# Patient Record
Sex: Female | Born: 1980 | Race: White | Hispanic: No | Marital: Married | Smoking: Never smoker
Health system: Southern US, Community
[De-identification: ages and names within clinical notes are randomized; demographics above are authoritative.]

## PROBLEM LIST (undated history)

## (undated) ENCOUNTER — Inpatient Hospital Stay (HOSPITAL_COMMUNITY): Payer: Self-pay

## (undated) DIAGNOSIS — R112 Nausea with vomiting, unspecified: Secondary | ICD-10-CM

## (undated) DIAGNOSIS — R0602 Shortness of breath: Secondary | ICD-10-CM

## (undated) DIAGNOSIS — O021 Missed abortion: Secondary | ICD-10-CM

## (undated) DIAGNOSIS — Z789 Other specified health status: Secondary | ICD-10-CM

## (undated) DIAGNOSIS — J45909 Unspecified asthma, uncomplicated: Secondary | ICD-10-CM

## (undated) DIAGNOSIS — Z9889 Other specified postprocedural states: Secondary | ICD-10-CM

## (undated) DIAGNOSIS — I7300 Raynaud's syndrome without gangrene: Secondary | ICD-10-CM

## (undated) HISTORY — PX: WISDOM TOOTH EXTRACTION: SHX21

## (undated) HISTORY — PX: KNEE SURGERY: SHX244

---

## 2010-10-08 ENCOUNTER — Inpatient Hospital Stay (HOSPITAL_COMMUNITY)
Admission: AD | Admit: 2010-10-08 | Discharge: 2010-10-10 | DRG: 373 | Disposition: A | Discharge status: Home / Self Care | Payer: BC Managed Care – PPO | Source: Ambulatory Visit | Attending: Obstetrics & Gynecology | Admitting: Obstetrics & Gynecology

## 2010-10-08 ENCOUNTER — Encounter (HOSPITAL_COMMUNITY): Payer: Self-pay

## 2010-10-08 LAB — CBC
HCT: 42.7 % (ref 36.0–46.0)
Hemoglobin: 15.2 g/dL — ABNORMAL HIGH (ref 12.0–15.0)
MCHC: 35.6 g/dL (ref 30.0–36.0)
RBC: 4.55 MIL/uL (ref 3.87–5.11)
WBC: 19.1 10*3/uL — ABNORMAL HIGH (ref 4.0–10.5)

## 2010-10-08 LAB — ABO/RH: RH Type: POSITIVE

## 2010-10-08 LAB — RPR: RPR: NONREACTIVE

## 2010-10-08 MED ORDER — OXYTOCIN 20 UNITS IN LACTATED RINGERS INFUSION - SIMPLE: 125.0000 mL/h | Freq: Once | INTRAVENOUS | Status: DC

## 2010-10-08 MED ORDER — BUTORPHANOL TARTRATE 2 MG/ML IJ SOLN
1.0000 mg | INTRAMUSCULAR | Status: DC | PRN
  Administered 2010-10-08 (×2): 1 mg via INTRAVENOUS
  Filled 2010-10-08 (×2): qty 1

## 2010-10-08 MED ORDER — OXYTOCIN BOLUS FROM INFUSION
500.0000 mL | Freq: Once | INTRAVENOUS | Status: DC
  Filled 2010-10-08: qty 1000
  Filled 2010-10-08: qty 500

## 2010-10-08 MED ORDER — FENTANYL 2.5 MCG/ML BUPIVACAINE 1/10 % EPIDURAL INFUSION (WH - ANES)
14.0000 mL/h | INTRAMUSCULAR | Status: DC
  Filled 2010-10-08: qty 60

## 2010-10-08 MED ORDER — ACETAMINOPHEN 325 MG PO TABS: 650.0000 mg | ORAL_TABLET | ORAL | Status: DC | PRN

## 2010-10-08 MED ORDER — EPHEDRINE 5 MG/ML INJ
10.0000 mg | INTRAVENOUS | Status: DC | PRN
  Filled 2010-10-08 (×2): qty 4

## 2010-10-08 MED ORDER — EPHEDRINE 5 MG/ML INJ
10.0000 mg | INTRAVENOUS | Status: DC | PRN
  Filled 2010-10-08: qty 4

## 2010-10-08 MED ORDER — LACTATED RINGERS IV SOLN: 500.0000 mL | Freq: Once | INTRAVENOUS | Status: DC

## 2010-10-08 MED ORDER — PHENYLEPHRINE 40 MCG/ML (10ML) SYRINGE FOR IV PUSH (FOR BLOOD PRESSURE SUPPORT)
80.0000 ug | PREFILLED_SYRINGE | INTRAVENOUS | Status: DC | PRN
  Filled 2010-10-08 (×2): qty 5

## 2010-10-08 MED ORDER — FLEET ENEMA 7-19 GM/118ML RE ENEM: 1.0000 | ENEMA | RECTAL | Status: DC | PRN

## 2010-10-08 MED ORDER — CITRIC ACID-SODIUM CITRATE 334-500 MG/5ML PO SOLN: 30.0000 mL | ORAL | Status: DC | PRN

## 2010-10-08 MED ORDER — LACTATED RINGERS IV SOLN: 500.0000 mL | INTRAVENOUS | Status: DC | PRN

## 2010-10-08 MED ORDER — LACTATED RINGERS IV SOLN
INTRAVENOUS | Status: DC
  Administered 2010-10-08: 300 mL via INTRAVENOUS
  Administered 2010-10-08: 21:00:00 via INTRAVENOUS

## 2010-10-08 MED ORDER — ONDANSETRON HCL 4 MG/2ML IJ SOLN: 4.0000 mg | Freq: Four times a day (QID) | INTRAMUSCULAR | Status: DC | PRN

## 2010-10-08 MED ORDER — IBUPROFEN 600 MG PO TABS: 600.0000 mg | ORAL_TABLET | Freq: Four times a day (QID) | ORAL | Status: DC | PRN

## 2010-10-08 MED ORDER — PHENYLEPHRINE 40 MCG/ML (10ML) SYRINGE FOR IV PUSH (FOR BLOOD PRESSURE SUPPORT)
80.0000 ug | PREFILLED_SYRINGE | INTRAVENOUS | Status: DC | PRN
  Filled 2010-10-08: qty 5

## 2010-10-08 MED ORDER — LIDOCAINE HCL (PF) 1 % IJ SOLN
30.0000 mL | INTRAMUSCULAR | Status: DC | PRN
  Filled 2010-10-08 (×2): qty 30

## 2010-10-08 MED ORDER — DIPHENHYDRAMINE HCL 50 MG/ML IJ SOLN: 12.5000 mg | INTRAMUSCULAR | Status: DC | PRN

## 2010-10-08 NOTE — Progress Notes (Signed)
Has had some small leaking episodes but had a big gush at 1730, irreg contractions

## 2010-10-08 NOTE — Progress Notes (Signed)
Birth plan reviewed with patient and Dr. Aldona Bar. Pt may walk and be monitored intermittently and continuously once walking is completed, hold IV until active labor or patient requests pain meds.

## 2010-10-08 NOTE — Progress Notes (Signed)
Progressed quickly and had a NSD of viable 7 pound 8 oz. Female infant over an intact perineum/Apgars 8/9.  Cord Blood Donor.  Placenta delivered intact.  2A/1V.  No significant tears.  Breast feed.

## 2010-10-08 NOTE — H&P (Signed)
30 year old G1P0 at 39+ weeks with SROM at 1730 today-clear fluid.  Now with some contractions.  Pregnancy benign.  -GBS.  Had an U/S late July for EFW and was "50%".  \  Allergic to Codeine.  PE:  VS stable Abd:  S+39 / FH reactive Cx:  3/80/vtx -1-2 --well applied  IMP:  Term IUP/SROM  Plan: Admit. Patient has a detailed birth plan and wishes to be involved in all decisions.  Does prefer to walk for now and be intermittently monitored until in active labor, etc.

## 2010-10-09 ENCOUNTER — Encounter (HOSPITAL_COMMUNITY): Payer: Self-pay | Admitting: *Deleted

## 2010-10-09 LAB — RPR: RPR Ser Ql: NONREACTIVE

## 2010-10-09 LAB — CBC
HCT: 41.2 % (ref 36.0–46.0)
MCV: 95.6 fL (ref 78.0–100.0)
RBC: 4.31 MIL/uL (ref 3.87–5.11)
WBC: 27.4 10*3/uL — ABNORMAL HIGH (ref 4.0–10.5)

## 2010-10-09 MED ORDER — TETANUS-DIPHTH-ACELL PERTUSSIS 5-2.5-18.5 LF-MCG/0.5 IM SUSP: 0.5000 mL | Freq: Once | INTRAMUSCULAR | Status: DC

## 2010-10-09 MED ORDER — ONDANSETRON HCL 4 MG PO TABS: 4.0000 mg | ORAL_TABLET | ORAL | Status: DC | PRN

## 2010-10-09 MED ORDER — ONDANSETRON HCL 4 MG/2ML IJ SOLN: 4.0000 mg | INTRAMUSCULAR | Status: DC | PRN

## 2010-10-09 MED ORDER — BENZOCAINE-MENTHOL 20-0.5 % EX AERO
INHALATION_SPRAY | CUTANEOUS | Status: AC
  Filled 2010-10-09: qty 56

## 2010-10-09 MED ORDER — ZOLPIDEM TARTRATE 5 MG PO TABS: 5.0000 mg | ORAL_TABLET | Freq: Every evening | ORAL | Status: DC | PRN

## 2010-10-09 MED ORDER — IBUPROFEN 600 MG PO TABS
600.0000 mg | ORAL_TABLET | Freq: Four times a day (QID) | ORAL | Status: DC
  Administered 2010-10-09: 600 mg via ORAL
  Filled 2010-10-09 (×3): qty 1

## 2010-10-09 MED ORDER — HYDROCORTISONE ACE-PRAMOXINE 1-1 % RE FOAM
1.0000 | Freq: Two times a day (BID) | RECTAL | Status: DC
  Administered 2010-10-10: 1 via RECTAL
  Filled 2010-10-09 (×2): qty 10

## 2010-10-09 MED ORDER — SIMETHICONE 80 MG PO CHEW: 80.0000 mg | CHEWABLE_TABLET | ORAL | Status: DC | PRN

## 2010-10-09 MED ORDER — LANOLIN HYDROUS EX OINT: TOPICAL_OINTMENT | CUTANEOUS | Status: DC | PRN

## 2010-10-09 MED ORDER — DIPHENHYDRAMINE HCL 25 MG PO CAPS: 25.0000 mg | ORAL_CAPSULE | Freq: Four times a day (QID) | ORAL | Status: DC | PRN

## 2010-10-09 MED ORDER — PRENATAL PLUS 27-1 MG PO TABS
1.0000 | ORAL_TABLET | Freq: Every day | ORAL | Status: DC
  Administered 2010-10-09 – 2010-10-10 (×2): 1 via ORAL
  Filled 2010-10-09 (×2): qty 1

## 2010-10-09 MED ORDER — DIBUCAINE 1 % RE OINT
TOPICAL_OINTMENT | RECTAL | Status: DC | PRN
Start: 2010-10-09 — End: 2010-10-10
  Administered 2010-10-10: 10:00:00 via RECTAL
  Filled 2010-10-09 (×2): qty 28

## 2010-10-09 MED ORDER — BENZOCAINE-MENTHOL 20-0.5 % EX AERO
1.0000 "application " | INHALATION_SPRAY | CUTANEOUS | Status: DC | PRN
  Administered 2010-10-09: 1 via TOPICAL

## 2010-10-09 MED ORDER — SENNOSIDES-DOCUSATE SODIUM 8.6-50 MG PO TABS
2.0000 | ORAL_TABLET | Freq: Every day | ORAL | Status: DC
  Administered 2010-10-09: 2 via ORAL

## 2010-10-09 NOTE — Progress Notes (Signed)
2240 Pt requesting epidural. SVE, pt complete and ready to push. Report provided to Dr. Aldona Bar via telephone. Instructed to push with patient he will come in immediately.

## 2010-10-09 NOTE — Progress Notes (Signed)
Post Partum Day 1 Subjective: no complaints  Objective: Blood pressure 122/71, pulse 83, temperature 98.4 F (36.9 C), temperature source Oral, resp. rate 20, height 5\' 6"  (1.676 m), weight 94.348 kg (208 lb), unknown if currently breastfeeding.  Physical Exam:  General: alert Lochia: appropriate Uterine Fundus: firm   Basename 10/09/10 0520 10/08/10 2105  HGB 14.3 15.2*  HCT 41.2 42.7    Assessment/Plan: Plan for discharge tomorrow   LOS: 1 day   Stephanie Herman D 10/09/2010, 9:40 AM

## 2010-10-10 NOTE — Progress Notes (Signed)
Post Partum Day 2 Subjective: no complaints  Objective: Blood pressure 115/72, pulse 65, temperature 98.4 F (36.9 C), temperature source Oral, resp. rate 20, height 5\' 6"  (1.676 m), weight 94.348 kg (208 lb), unknown if currently breastfeeding.  Physical Exam:  General: alert Lochia: appropriate Uterine Fundus: firm   Basename 10/09/10 0520 10/08/10 2105  HGB 14.3 15.2*  HCT 41.2 42.7    Assessment/Plan: Discharge home and Breastfeeding   LOS: 2 days   KAPLAN,RICHARD D 10/10/2010, 9:54 AM

## 2010-10-10 NOTE — Discharge Summary (Signed)
Obstetric Discharge Summary Reason for Admission: onset of labor Prenatal Procedures: ultrasound Intrapartum Procedures: spontaneous vaginal delivery Postpartum Procedures: none Complications-Operative and Postpartum: none Hemoglobin  Date Value Range Status  10/09/2010 14.3  12.0-15.0 (g/dL) Final     HCT  Date Value Range Status  10/09/2010 41.2  36.0-46.0 (%) Final    Discharge Diagnoses: Term Pregnancy-delivered  Discharge Information: Date: 10/10/2010 Activity: pelvic rest Diet: routine Medications: PNV and Ibuprophen Condition: stable Instructions: refer to practice specific booklet Discharge to: home Follow-up Information    Follow up with WEIN,ROBERT M in 5 weeks.   Contact information:   [address] [place] [address] [phone]          Newborn Data: Live born female  Birth Weight: 7 lb 7.8 oz (3395 g) APGAR: 8, 9  Home with mother.  KAPLAN,RICHARD D 10/10/2010, 9:57 AM

## 2011-06-07 ENCOUNTER — Inpatient Hospital Stay (HOSPITAL_COMMUNITY)
Admission: AD | Admit: 2011-06-07 | Discharge: 2011-06-07 | Disposition: A | Discharge status: Home / Self Care | Payer: BC Managed Care – PPO | Source: Ambulatory Visit | Attending: Obstetrics & Gynecology | Admitting: Obstetrics & Gynecology

## 2011-06-07 ENCOUNTER — Encounter (HOSPITAL_COMMUNITY): Payer: Self-pay | Admitting: *Deleted

## 2011-06-07 DIAGNOSIS — O209 Hemorrhage in early pregnancy, unspecified: Secondary | ICD-10-CM

## 2011-06-07 HISTORY — DX: Other specified health status: Z78.9

## 2011-06-07 LAB — HCG, QUANTITATIVE, PREGNANCY: hCG, Beta Chain, Quant, S: 14087 m[IU]/mL — ABNORMAL HIGH (ref <5)

## 2011-06-07 NOTE — MAU Note (Signed)
Pt in believes she is having a miscarriage. LMP 04/28/11.  Sudden onset of severe cramping and heavy bleeding this morning with large clots.  Clots have decreased and pain has subsided, but still bleeding pretty heavy.  Has not taken UPT.

## 2011-06-07 NOTE — Discharge Instructions (Signed)
Vaginal Bleeding During Pregnancy, First Trimester  A small amount of bleeding (spotting) is relatively common in early pregnancy. It usually stops on its own. There are many causes for bleeding or spotting in early pregnancy. Some bleeding may be related to the pregnancy and some may not. Cramping with the bleeding is more serious and concerning. Tell your caregiver if you have any vaginal bleeding.   CAUSES    It is normal in most cases.   The pregnancy ends (miscarriage).   The pregnancy may end (threatened miscarriage).   Infection or inflammation of the cervix.   Growths (polyps) on the cervix.   Pregnancy happens outside of the uterus and in a fallopian tube (tubal pregnancy).   Many tiny cysts in the uterus instead of pregnancy tissue (molar pregnancy).  SYMPTOMS   Vaginal bleeding or spotting with or without cramps.  DIAGNOSIS   To evaluate the pregnancy, your caregiver may:   Do a pelvic exam.   Take blood tests.   Do an ultrasound.  It is very important to follow your caregiver's instructions.   TREATMENT    Evaluation of the pregnancy with blood tests and ultrasound.   Bed rest (getting up to use the bathroom only).   Rho-gam immunization if the mother is Rh negative and the father is Rh positive.  HOME CARE INSTRUCTIONS    If your caregiver orders bed rest, you may need to make arrangements for the care of other children and for other responsibilities. However, your caregiver may allow you to continue light activity.   Keep track of the number of pads you use each day, how often you change pads and how soaked (saturated) they are. Write this down.   Do not use tampons. Do not douche.   Do not have sexual intercourse or orgasms until approved by your physician.   Save any tissue that you pass for your caregiver to see.   Take medicine for cramps only with your caregiver's permission.   Do not take aspirin because it can make you bleed.  SEEK IMMEDIATE MEDICAL CARE IF:    You  experience severe cramps in your stomach, back or belly (abdomen).   You have an oral temperature above 102 F (38.9 C), not controlled by medicine.   You pass large clots or tissue.   Your bleeding increases or you become light-headed, weak or have fainting episodes.   You develop chills.   You are leaking or have a gush of fluid from your vagina.   You pass out while having a bowel movement. That may mean you have a ruptured tubal pregnancy.  Document Released: 11/04/2004 Document Revised: 01/14/2011 Document Reviewed: 05/16/2008  [employer] Patient Information 2012 [employer].

## 2011-06-07 NOTE — MAU Note (Signed)
Patient states she is late for her period and believes that she was pregnant. Had heavy bleeding with clots and a lot of cramping this am. Continues to have bleeding but no pain at this time. Had not had confirmation of pregnancy.

## 2011-06-07 NOTE — MAU Provider Note (Signed)
  History     CSN: [id number]  Arrival date and time: 06/07/11 6644   First Provider Initiated Contact with Patient 06/07/11 0920      Chief Complaint  Patient presents with  . Vaginal Bleeding   HPI Stephanie Herman is 31 y.o. G2P1001, patient of Dr. Tenny Craw, [redacted]w[redacted]d weeks presenting with report she thought she started her period yesterday am and woke up 5am today changed tampon, severe cramping over the next hour with heavy bleeding/3 large clot.  Bleeding has slowed.  Passed 1 clot here. Spoke with Dr. Arlyce Dice this am instructed to come here for evaluation.     Past Medical History  Diagnosis Date  . No pertinent past medical history   . Complication of anesthesia     nausea and vomiting    Past Surgical History  Procedure Date  . Knee surgery   . Wisdom tooth extraction     Family History  Problem Relation Age of Onset  . Anesthesia problems Neg Hx     History  Substance Use Topics  . Smoking status: Never Smoker   . Smokeless tobacco: Never Used  . Alcohol Use: No    Allergies:  Allergies  Allergen Reactions  . Codeine Other (See Comments)    Body goes into shock  . Lidocaine Other (See Comments)    Pt states Liquid Anesthesia makes her violently sick.  Can tolerate topical    Prescriptions prior to admission  Medication Sig Dispense Refill  . prenatal vitamin w/FE, FA (PRENATAL 1 + 1) 27-1 MG TABS Take 1 tablet by mouth daily.        . Tetrahydrozoline HCl (EYE DROPS OP) Apply to eye.          Review of Systems  Gastrointestinal: Positive for abdominal pain (cramping).  Genitourinary:       + for vaginal bleeding and clots   Physical Exam   Blood pressure 133/69, pulse 59, temperature 98 F (36.7 C), temperature source Oral, resp. rate 16, height 5\' 5"  (1.651 m), weight 71.578 kg (157 lb 12.8 oz), last menstrual period 04/29/2011, SpO2 100.00%, currently breastfeeding.  Physical Exam  Constitutional: She is oriented to person, place, and time. She  appears well-developed and well-nourished. No distress.  HENT:  Head: Normocephalic.  Neck: Normal range of motion.  Cardiovascular: Normal rate.   Respiratory: Effort normal.  Genitourinary:       Not indicated, bleeding light per her report  Neurological: She is alert and oriented to person, place, and time.  Psychiatric: She has a normal mood and affect. Her behavior is normal.   Results for orders placed during the hospital encounter of 06/07/11 (from the past 24 hour(s))  POCT PREGNANCY, URINE     Status: Abnormal   Collection Time   06/07/11  9:19 AM      Component Value Range   Preg Test, Ur POSITIVE (*) NEGATIVE    MAU Course  Procedures  BHCG drawn  MDM 09:30  Consulted with Dr. Aldona Bar.  Discussed patient's sxs, + UPT.  Order given to draw BHCG and have her call office for repeat in 2 days.  The patient is stable, sitting and nursing her 84 month old.    Assessment and Plan  A:  Bleeding in early pregnancy   P:  Call office for appt on Wednesday for repeat BHCG.     INstructed to call her doctor for increased pain or bleeding.  Stephanie Herman,EVE M 06/07/2011, 9:23 AM

## 2011-10-25 ENCOUNTER — Encounter (HOSPITAL_COMMUNITY): Payer: Self-pay | Admitting: Anesthesiology

## 2011-10-25 ENCOUNTER — Encounter (HOSPITAL_COMMUNITY): Payer: Self-pay | Admitting: *Deleted

## 2011-10-25 ENCOUNTER — Ambulatory Visit (HOSPITAL_COMMUNITY)
Admission: AD | Admit: 2011-10-25 | Discharge: 2011-10-25 | Disposition: A | Discharge status: Home / Self Care | Payer: BC Managed Care – PPO | Source: Ambulatory Visit | Attending: Obstetrics and Gynecology | Admitting: Obstetrics and Gynecology

## 2011-10-25 ENCOUNTER — Ambulatory Visit (HOSPITAL_COMMUNITY): Payer: BC Managed Care – PPO | Admitting: Anesthesiology

## 2011-10-25 ENCOUNTER — Encounter (HOSPITAL_COMMUNITY)
Admission: AD | Disposition: A | Discharge status: Home / Self Care | Payer: Self-pay | Source: Ambulatory Visit | Attending: Obstetrics and Gynecology

## 2011-10-25 DIAGNOSIS — O021 Missed abortion: Secondary | ICD-10-CM | POA: Insufficient documentation

## 2011-10-25 HISTORY — DX: Raynaud's syndrome without gangrene: I73.00

## 2011-10-25 HISTORY — DX: Missed abortion: O02.1

## 2011-10-25 HISTORY — DX: Other specified postprocedural states: Z98.890

## 2011-10-25 HISTORY — DX: Nausea with vomiting, unspecified: R11.2

## 2011-10-25 HISTORY — PX: DILATION AND EVACUATION: SHX1459

## 2011-10-25 HISTORY — DX: Shortness of breath: R06.02

## 2011-10-25 LAB — CBC
HCT: 41.2 % (ref 36.0–46.0)
Hemoglobin: 14.1 g/dL (ref 12.0–15.0)
MCH: 31.1 pg (ref 26.0–34.0)
MCV: 90.9 fL (ref 78.0–100.0)
Platelets: 133 10*3/uL — ABNORMAL LOW (ref 150–400)
RBC: 4.53 MIL/uL (ref 3.87–5.11)
WBC: 4 10*3/uL (ref 4.0–10.5)

## 2011-10-25 LAB — TYPE AND SCREEN

## 2011-10-25 LAB — ABO/RH: ABO/RH(D): A POS

## 2011-10-25 LAB — T4, FREE: Free T4: 1.41 ng/dL (ref 0.80–1.80)

## 2011-10-25 SURGERY — DILATION AND EVACUATION, UTERUS
Anesthesia: General | Site: Uterus | Wound class: Clean Contaminated

## 2011-10-25 MED ORDER — PROPOFOL 10 MG/ML IV EMUL
INTRAVENOUS | Status: AC
  Filled 2011-10-25: qty 40

## 2011-10-25 MED ORDER — PROPOFOL 10 MG/ML IV EMUL
INTRAVENOUS | Status: AC
  Filled 2011-10-25: qty 20

## 2011-10-25 MED ORDER — ACETAMINOPHEN 10 MG/ML IV SOLN
1000.0000 mg | Freq: Four times a day (QID) | INTRAVENOUS | Status: DC
  Filled 2011-10-25 (×4): qty 100

## 2011-10-25 MED ORDER — LACTATED RINGERS IV SOLN
INTRAVENOUS | Status: DC
  Administered 2011-10-25: 50 mL/h via INTRAVENOUS

## 2011-10-25 MED ORDER — ONDANSETRON HCL 4 MG/2ML IJ SOLN
INTRAMUSCULAR | Status: DC | PRN
  Administered 2011-10-25: 4 mg via INTRAVENOUS

## 2011-10-25 MED ORDER — DEXAMETHASONE SODIUM PHOSPHATE 10 MG/ML IJ SOLN
INTRAMUSCULAR | Status: AC
  Filled 2011-10-25: qty 1

## 2011-10-25 MED ORDER — BUPIVACAINE HCL (PF) 0.25 % IJ SOLN
INTRAMUSCULAR | Status: DC | PRN
  Administered 2011-10-25: 10 mL

## 2011-10-25 MED ORDER — PROPOFOL 10 MG/ML IV EMUL
INTRAVENOUS | Status: DC | PRN
  Administered 2011-10-25: 50 mg via INTRAVENOUS
  Administered 2011-10-25 (×2): 100 mg via INTRAVENOUS
  Administered 2011-10-25: 130 mg via INTRAVENOUS
  Administered 2011-10-25: 50 mg via INTRAVENOUS

## 2011-10-25 MED ORDER — LACTATED RINGERS IV SOLN: INTRAVENOUS | Status: DC

## 2011-10-25 MED ORDER — ONDANSETRON HCL 4 MG/2ML IJ SOLN
INTRAMUSCULAR | Status: AC
  Filled 2011-10-25: qty 2

## 2011-10-25 MED ORDER — ACETAMINOPHEN 10 MG/ML IV SOLN
INTRAVENOUS | Status: AC
  Administered 2011-10-25: 1000 mg via INTRAVENOUS
  Filled 2011-10-25: qty 100

## 2011-10-25 MED ORDER — SCOPOLAMINE 1 MG/3DAYS TD PT72
1.0000 | MEDICATED_PATCH | TRANSDERMAL | Status: DC
  Administered 2011-10-25: 1.5 mg via TRANSDERMAL

## 2011-10-25 MED ORDER — HYDROCODONE-ACETAMINOPHEN 5-500 MG PO TABS: 1.0000 | ORAL_TABLET | ORAL | Status: DC | PRN

## 2011-10-25 MED ORDER — IBUPROFEN 600 MG PO TABS: 600.0000 mg | ORAL_TABLET | Freq: Four times a day (QID) | ORAL | Status: DC | PRN

## 2011-10-25 MED ORDER — SCOPOLAMINE 1 MG/3DAYS TD PT72
MEDICATED_PATCH | TRANSDERMAL | Status: AC
  Administered 2011-10-25: 1.5 mg via TRANSDERMAL
  Filled 2011-10-25: qty 1

## 2011-10-25 MED ORDER — KETOROLAC TROMETHAMINE 60 MG/2ML IM SOLN
INTRAMUSCULAR | Status: AC
  Filled 2011-10-25: qty 2

## 2011-10-25 MED ORDER — BUPIVACAINE HCL (PF) 0.25 % IJ SOLN
INTRAMUSCULAR | Status: AC
  Filled 2011-10-25: qty 30

## 2011-10-25 MED ORDER — KETOROLAC TROMETHAMINE 30 MG/ML IJ SOLN
INTRAMUSCULAR | Status: AC
  Filled 2011-10-25: qty 1

## 2011-10-25 MED ORDER — LACTATED RINGERS IV SOLN
INTRAVENOUS | Status: DC
  Administered 2011-10-25 (×2): via INTRAVENOUS
  Administered 2011-10-25: 125 mL/h via INTRAVENOUS

## 2011-10-25 MED ORDER — MIDAZOLAM HCL 5 MG/5ML IJ SOLN
INTRAMUSCULAR | Status: DC | PRN
  Administered 2011-10-25: 2 mg via INTRAVENOUS

## 2011-10-25 MED ORDER — FENTANYL CITRATE 0.05 MG/ML IJ SOLN: 25.0000 ug | INTRAMUSCULAR | Status: DC | PRN

## 2011-10-25 MED ORDER — MIDAZOLAM HCL 2 MG/2ML IJ SOLN
INTRAMUSCULAR | Status: AC
  Filled 2011-10-25: qty 2

## 2011-10-25 MED ORDER — KETOROLAC TROMETHAMINE 15 MG/ML IJ SOLN
INTRAMUSCULAR | Status: DC | PRN
  Administered 2011-10-25 (×2): 30 mg via INTRAMUSCULAR

## 2011-10-25 SURGICAL SUPPLY — 16 items
CATH ROBINSON RED A/P 16FR (CATHETERS) ×2 IMPLANT
CLOTH BEACON ORANGE TIMEOUT ST (SAFETY) ×2 IMPLANT
DRESSING TELFA 8X3 (GAUZE/BANDAGES/DRESSINGS) ×2 IMPLANT
GLOVE BIO SURGEON STRL SZ7 (GLOVE) ×4 IMPLANT
GOWN PREVENTION PLUS XLARGE (GOWN DISPOSABLE) ×2 IMPLANT
GOWN STRL REIN XL XLG (GOWN DISPOSABLE) ×2 IMPLANT
KIT BERKELEY 1ST TRIMESTER 3/8 (MISCELLANEOUS) ×2 IMPLANT
NEEDLE SPNL 22GX3.5 QUINCKE BK (NEEDLE) ×2 IMPLANT
NS IRRIG 1000ML POUR BTL (IV SOLUTION) ×2 IMPLANT
PACK VAGINAL MINOR WOMEN LF (CUSTOM PROCEDURE TRAY) ×2 IMPLANT
PAD OB MATERNITY 4.3X12.25 (PERSONAL CARE ITEMS) ×2 IMPLANT
PAD PREP 24X48 CUFFED NSTRL (MISCELLANEOUS) ×2 IMPLANT
SET BERKELEY SUCTION TUBING (SUCTIONS) ×2 IMPLANT
SYR CONTROL 10ML LL (SYRINGE) ×2 IMPLANT
TOWEL OR 17X24 6PK STRL BLUE (TOWEL DISPOSABLE) ×4 IMPLANT
VACURETTE 9 RIGID CVD (CANNULA) ×2 IMPLANT

## 2011-10-25 NOTE — H&P (Signed)
Stephanie Herman is an 31 y.o. female. Suction D&E for 8 week missed abortion  31 yo G3P1011 who presents for suction D&E for a missed abortion.  The patient was seen in the office Friday for her first prenatal visit.  She should have been 9+5 at that time but ultrasound showed an IUP with NO fetal cardiac activity.  CRL was 8+4.  Management options were discussed with the patient and her family and they decided to proceed with surgical management.  R/B/A were reviewed with the patient and she wishes to proceed     Past Medical History  Diagnosis Date  . No pertinent past medical history   . Complication of anesthesia     nausea and vomiting    Past Surgical History  Procedure Date  . Knee surgery   . Wisdom tooth extraction     Family History  Problem Relation Age of Onset  . Anesthesia problems Neg Hx     Social History:  reports that she has never smoked. She has never used smokeless tobacco. She reports that she does not drink alcohol or use illicit drugs.  Allergies:  Allergies  Allergen Reactions  . Codeine Other (See Comments)    Body goes into shock  . Lidocaine Other (See Comments)    Pt states Liquid Anesthesia makes her violently sick.  Can tolerate topical  . Morphine And Related Other (See Comments)    Body goes into shock    Prescriptions prior to admission  Medication Sig Dispense Refill  . CALCIUM PO Take 2 tablets by mouth 2 (two) times daily.      . prenatal vitamin w/FE, FA (PRENATAL 1 + 1) 27-1 MG TABS Take 1 tablet by mouth daily.          ROS: as above  Last menstrual period 04/29/2011, currently breastfeeding. Physical Exam  AOX3 8 week sized uterus  U/S report as above  No results found for this or any previous visit (from the past 24 hour(s)).  No results found.  Assessment/Plan: 1) Consent 2) SCDs 3) Antiphospholipid antibodies, LA, TSH/T4/T3/TPO  Cashmere Dingley H. 10/25/2011, 8:57 AM

## 2011-10-25 NOTE — Anesthesia Preprocedure Evaluation (Signed)
Anesthesia Evaluation  Patient identified by MRN, date of birth, ID band Patient awake    Reviewed: Allergy & Precautions, H&P , Patient's Chart, lab work & pertinent test results, reviewed documented beta blocker date and time   History of Anesthesia Complications (+) PONV  Airway Mallampati: II TM Distance: >3 FB Neck ROM: full    Dental No notable dental hx.    Pulmonary  breath sounds clear to auscultation  Pulmonary exam normal       Cardiovascular Rhythm:regular Rate:Normal     Neuro/Psych    GI/Hepatic   Endo/Other    Renal/GU      Musculoskeletal   Abdominal   Peds  Hematology   Anesthesia Other Findings No actual allergy to MSO4, or Lidocaine. Based on Family HX We will avoid Narcotics, add Toradol and Tylenol for PONV avoidance  Reproductive/Obstetrics                           Anesthesia Physical Anesthesia Plan  ASA: II  Anesthesia Plan: General   Post-op Pain Management:    Induction: Intravenous  Airway Management Planned: LMA  Additional Equipment:   Intra-op Plan:   Post-operative Plan:   Informed Consent: I have reviewed the patients History and Physical, chart, labs and discussed the procedure including the risks, benefits and alternatives for the proposed anesthesia with the patient or authorized representative who has indicated his/her understanding and acceptance.   Dental Advisory Given  Plan Discussed with: CRNA and Surgeon  Anesthesia Plan Comments: (  Discussed  general anesthesia, including possible nausea, instrumentation of airway, sore throat,pulmonary aspiration, etc. I asked if the were any outstanding questions, or  concerns before we proceeded. )        Anesthesia Quick Evaluation

## 2011-10-25 NOTE — Transfer of Care (Signed)
Immediate Anesthesia Transfer of Care Note  Patient: Stephanie Herman  Procedure(s) Performed: Procedure(s) (LRB) with comments: DILATATION AND EVACUATION (N/A)  Patient Location: PACU  Anesthesia Type: General  Level of Consciousness: awake and sedated  Airway & Oxygen Therapy: Patient Spontanous Breathing and Patient connected to nasal cannula oxygen  Post-op Assessment: Report given to PACU RN and Post -op Vital signs reviewed and stable  Post vital signs: Reviewed and stable  Complications: No apparent anesthesia complications

## 2011-10-25 NOTE — Op Note (Signed)
Pre-Operative Diagnosis: 1) 8 week missed abortion Postoperative Diagnosis: Same Procedure: Suction D&E Surgeon: Dr. Waynard Reeds Assistant: None Operative Findings: General with propofol and 10cc 0.25% marcaine paracervical Specimen: POCs, with a portion collected for karyotype and FISH EBL: minimal  Stephanie Herman Is a 31 year old gravida 3 para 1021 who presents for definitive surgical management for a 8 week missed abortion. Please see the patient's history and physical for complete details of the history. Management options were discussed with the patient. R/B/A reviewed. Following appropriate informed consent was taken to the operating room. The patient was appropriately identified during a time out procedure. General anesthesia was administered and the patient was placed in the dorsal lithotomy position. The patient was prepped and draped in the normal sterile fashion. A speculum was placed into the vagina, a single-tooth tenaculum was placed on the anterior lip of the cervix, and 10 cc of 0.25% marcaine was administered in a paracervical fashion. The cervix was serially dilated with Hank dilators. . A 9 french suction curet was then passed to the fundus, the vacuum was engaged, and 3 suction passes were performed with a curette. A Sharp curettage was performed and a gritty texture was noted. A final suction pass was performed with minimal results. This completed the procedure. The patient tolerated the procedure well was brought to the recovery room in stable condition for the procedure. All sponge and needle counts correct x2.

## 2011-10-25 NOTE — Progress Notes (Signed)
I visited with Stephanie Herman and her husband before her procedure.  They were coping well under the circumstances, and they have good support from family and friends.  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  They did not wish to visit further at this time.  Please page if need for a chaplain arises after the procedure.  [phone].  Chaplain Dyanne Carrel 10:15   10/25/11 1000  Clinical Encounter Type  Visited With Patient and family together  Visit Type Spiritual support  Spiritual Encounters  Spiritual Needs Emotional  Stress Factors  Patient Stress Factors Loss

## 2011-10-25 NOTE — Anesthesia Postprocedure Evaluation (Signed)
  Anesthesia Post-op Note  Patient: Stephanie Herman  Procedure(s) Performed: Procedure(s) (LRB) with comments: DILATATION AND EVACUATION (N/A)  Patient is awake and responsive. Pain and nausea are reasonably well controlled. Vital signs are stable and clinically acceptable. Oxygen saturation is clinically acceptable. There are no apparent anesthetic complications at this time. Patient is ready for discharge.

## 2011-10-26 ENCOUNTER — Encounter (HOSPITAL_COMMUNITY): Payer: Self-pay | Admitting: Obstetrics and Gynecology

## 2011-10-26 LAB — LUPUS ANTICOAGULANT PANEL: Lupus Anticoagulant: NOT DETECTED

## 2011-11-16 LAB — CHROMOSOME STD, POC(TISSUE)-NCBH

## 2012-02-09 NOTE — L&D Delivery Note (Signed)
Delivery Note At 8:11 AM a viable and healthy female was delivered via Vaginal, Spontaneous Delivery (Presentation: Left Occiput Anterior).  APGAR: 8, 9; weight pending.   Placenta status: Intact, Spontaneous.  Cord: 3 vessels with the following complications: None.  Anesthesia: Epidural  Episiotomy: None Lacerations: none Suture Repair: none Est. Blood Loss (mL): 250 cc  Mom to postpartum.  Baby to nursery-stable.  Zeyna Mkrtchyan H. 11/17/2012, 8:30 AM

## 2012-04-07 LAB — OB RESULTS CONSOLE GC/CHLAMYDIA
Chlamydia: NEGATIVE
Gonorrhea: NEGATIVE

## 2012-04-07 LAB — OB RESULTS CONSOLE RUBELLA ANTIBODY, IGM: Rubella: IMMUNE

## 2012-04-07 LAB — OB RESULTS CONSOLE RPR: RPR: NONREACTIVE

## 2012-04-07 LAB — OB RESULTS CONSOLE HIV ANTIBODY (ROUTINE TESTING): HIV: NONREACTIVE

## 2012-09-09 ENCOUNTER — Encounter (HOSPITAL_COMMUNITY): Payer: Self-pay | Admitting: *Deleted

## 2012-09-09 ENCOUNTER — Inpatient Hospital Stay (HOSPITAL_COMMUNITY)
Admission: AD | Admit: 2012-09-09 | Discharge: 2012-09-09 | Disposition: A | Discharge status: Home / Self Care | Payer: BC Managed Care – PPO | Source: Ambulatory Visit | Attending: Obstetrics and Gynecology | Admitting: Obstetrics and Gynecology

## 2012-09-09 DIAGNOSIS — O368190 Decreased fetal movements, unspecified trimester, not applicable or unspecified: Secondary | ICD-10-CM | POA: Insufficient documentation

## 2012-09-09 DIAGNOSIS — Z3689 Encounter for other specified antenatal screening: Secondary | ICD-10-CM

## 2012-09-09 DIAGNOSIS — O368131 Decreased fetal movements, third trimester, fetus 1: Secondary | ICD-10-CM

## 2012-09-09 NOTE — MAU Note (Signed)
Pt reports fetal movement has been less for the past 2 days. Denies Pain or discomfort.

## 2012-09-09 NOTE — MAU Provider Note (Signed)
Chief Complaint:  Decreased Fetal Movement   None     HPI: Stephanie Herman is a 32 y.o. G3P1001 at [redacted]w[redacted]d who presents to maternity admissions reporting decreased fetal movement.  She reports fetal movement since arrival in MAU, denies LOF, vaginal bleeding, vaginal itching/burning, urinary symptoms, h/a, dizziness, n/v, or fever/chills.     Past Medical History: Past Medical History  Diagnosis Date  . No pertinent past medical history   . PONV (postoperative nausea and vomiting)   . SVD (spontaneous vaginal delivery)     x 1  . Shortness of breath     sports induced - no inhaler - last episode 6 yrs ago  . Raynaud's disease   . Missed abortion     no surgery required    Past obstetric history: OB History   Grav Para Term Preterm Abortions TAB SAB Ect Mult Living   3 1 1       1      # Outc Date GA Lbr Len/2nd Wgt Sex Del Anes PTL Lv   1 TRM 8/12 [redacted]w[redacted]d 03:10 / 00:56 3.395kg(7lb7.8oz)  SVD None  Yes   2 GRA            Comments: System Generated. Please review and update pregnancy details.   3 CUR               Past Surgical History: Past Surgical History  Procedure Laterality Date  . Knee surgery      bilateral  . Wisdom tooth extraction    . Dilation and evacuation  10/25/2011    Procedure: DILATATION AND EVACUATION;  Surgeon: Freddrick March. Tenny Craw, MD;  Location: [facility];  Service: Gynecology;  Laterality: N/A;    Family History: Family History  Problem Relation Age of Onset  . Anesthesia problems Neg Hx   . Arthritis Mother   . Hypertension Mother   . Arthritis Father   . Cancer Father   . Hypertension Father   . Arthritis Maternal Grandmother   . Cancer Maternal Grandmother   . Arthritis Maternal Grandfather   . Cancer Maternal Grandfather   . Heart disease Paternal Grandfather     Social History: History  Substance Use Topics  . Smoking status: Never Smoker   . Smokeless tobacco: Never Used  . Alcohol Use: No    Allergies:  Allergies  Allergen  Reactions  . Codeine Other (See Comments)    Body goes into shock  . Lidocaine Other (See Comments)    Pt states Liquid Anesthesia makes her violently sick.  Can tolerate topical  . Morphine And Related Other (See Comments)    Body goes into shock    Meds:  Prescriptions prior to admission  Medication Sig Dispense Refill  . Prenatal Vit-Fe Fumarate-FA (PRENATAL MULTIVITAMIN) TABS Take 1 tablet by mouth every morning.      . [DISCONTINUED] HYDROcodone-acetaminophen (VICODIN) 5-500 MG per tablet Take 1 tablet by mouth every 4 (four) hours as needed for pain.  20 tablet  0  . [DISCONTINUED] ibuprofen (ADVIL,MOTRIN) 600 MG tablet Take 1 tablet (600 mg total) by mouth every 6 (six) hours as needed for pain.  90 tablet  0    ROS: Pertinent findings in history of present illness.  Physical Exam  Blood pressure 127/71, pulse 76, temperature 97.9 F (36.6 C), temperature source Oral, resp. rate 18, height 5\' 6"  (1.676 m), weight 82.918 kg (182 lb 12.8 oz), last menstrual period 08/15/2011. GENERAL: Well-developed, well-nourished female in no acute  distress.  HEENT: normocephalic HEART: normal rate RESP: normal effort ABDOMEN: Soft, non-tender, gravid appropriate for gestational age EXTREMITIES: Nontender, no edema NEURO: alert and oriented SPECULUM EXAM: NEFG, physiologic discharge, no blood, cervix clean    FHT:  Baseline 135 , moderate variability, accelerations present, no decelerations Contractions: None on toco or by palpation    Assessment: 1. NST (non-stress test) reactive   2. Decreased fetal movement, third trimester, fetus 1     Plan: Consulted with Dr Henderson Cloud Discharge home  Reviewed fetal kick counts with pt F/U in office next week Return to MAU as needed  Follow-up Information   Follow up with HORVATH,MICHELLE A, MD. (Return to MAU as needed.)    Contact information:   [address] [place] [address] [phone]        Medication List     STOP taking these medications       HYDROcodone-acetaminophen 5-500 MG per tablet  Commonly known as:  VICODIN     ibuprofen 600 MG tablet  Commonly known as:  ADVIL,MOTRIN      TAKE these medications       prenatal multivitamin Tabs  Take 1 tablet by mouth every morning.        Sharen Counter Certified Nurse-Midwife 09/09/2012 7:38 PM

## 2012-09-11 NOTE — MAU Provider Note (Signed)
agree

## 2012-10-02 ENCOUNTER — Encounter (HOSPITAL_COMMUNITY): Payer: Self-pay | Admitting: *Deleted

## 2012-10-02 ENCOUNTER — Inpatient Hospital Stay (HOSPITAL_COMMUNITY)
Admission: AD | Admit: 2012-10-02 | Discharge: 2012-10-02 | Disposition: A | Discharge status: Home / Self Care | Payer: BC Managed Care – PPO | Source: Ambulatory Visit | Attending: Obstetrics and Gynecology | Admitting: Obstetrics and Gynecology

## 2012-10-02 DIAGNOSIS — Y92410 Unspecified street and highway as the place of occurrence of the external cause: Secondary | ICD-10-CM | POA: Insufficient documentation

## 2012-10-02 DIAGNOSIS — O99891 Other specified diseases and conditions complicating pregnancy: Secondary | ICD-10-CM | POA: Insufficient documentation

## 2012-10-02 DIAGNOSIS — Z041 Encounter for examination and observation following transport accident: Secondary | ICD-10-CM

## 2012-10-02 HISTORY — DX: Unspecified asthma, uncomplicated: J45.909

## 2012-10-02 MED ORDER — LACTATED RINGERS IV BOLUS (SEPSIS): 1000.0000 mL | Freq: Once | INTRAVENOUS | Status: DC
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
## 2012-10-05 ENCOUNTER — Encounter (HOSPITAL_COMMUNITY): Payer: Self-pay

## 2012-10-05 ENCOUNTER — Inpatient Hospital Stay (HOSPITAL_COMMUNITY)
Admission: AD | Admit: 2012-10-05 | Discharge: 2012-10-06 | Disposition: A | Discharge status: Home / Self Care | Payer: BC Managed Care – PPO | Source: Ambulatory Visit | Attending: Obstetrics and Gynecology | Admitting: Obstetrics and Gynecology

## 2012-10-05 DIAGNOSIS — O368131 Decreased fetal movements, third trimester, fetus 1: Secondary | ICD-10-CM

## 2012-10-05 DIAGNOSIS — O368190 Decreased fetal movements, unspecified trimester, not applicable or unspecified: Secondary | ICD-10-CM
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
## 2012-10-20 LAB — OB RESULTS CONSOLE GBS: GBS: NEGATIVE

## 2012-11-16 ENCOUNTER — Encounter (HOSPITAL_COMMUNITY): Payer: Self-pay | Admitting: *Deleted

## 2012-11-16 ENCOUNTER — Inpatient Hospital Stay (HOSPITAL_COMMUNITY)
Admission: AD | Admit: 2012-11-16 | Discharge: 2012-11-19 | DRG: 373 | Disposition: A | Discharge status: Home / Self Care | Payer: BC Managed Care – PPO | Source: Ambulatory Visit | Attending: Obstetrics and Gynecology | Admitting: Obstetrics and Gynecology

## 2012-11-16 LAB — CBC
HCT: 40.5 % (ref 36.0–46.0)
Hemoglobin: 14.4 g/dL (ref 12.0–15.0)
MCH: 32.8 pg (ref 26.0–34.0)
MCHC: 35.6 g/dL (ref 30.0–36.0)
MCV: 92.3 fL (ref 78.0–100.0)

## 2012-11-16 LAB — TYPE AND SCREEN: ABO/RH(D): A POS

## 2012-11-16 MED ORDER — PHENYLEPHRINE 40 MCG/ML (10ML) SYRINGE FOR IV PUSH (FOR BLOOD PRESSURE SUPPORT)
80.0000 ug | PREFILLED_SYRINGE | INTRAVENOUS | Status: DC | PRN
  Filled 2012-11-16: qty 5

## 2012-11-16 MED ORDER — ZOLPIDEM TARTRATE 5 MG PO TABS: 5.0000 mg | ORAL_TABLET | Freq: Every evening | ORAL | Status: DC | PRN

## 2012-11-16 MED ORDER — DIPHENHYDRAMINE HCL 50 MG/ML IJ SOLN: 12.5000 mg | INTRAMUSCULAR | Status: DC | PRN

## 2012-11-16 MED ORDER — MISOPROSTOL 25 MCG QUARTER TABLET
75.0000 ug | ORAL_TABLET | Freq: Once | ORAL | Status: AC
  Administered 2012-11-16: 75 ug via ORAL
  Filled 2012-11-16: qty 0.75

## 2012-11-16 MED ORDER — IBUPROFEN 600 MG PO TABS: 600.0000 mg | ORAL_TABLET | Freq: Four times a day (QID) | ORAL | Status: DC | PRN

## 2012-11-16 MED ORDER — LIDOCAINE HCL (PF) 1 % IJ SOLN
30.0000 mL | INTRAMUSCULAR | Status: DC | PRN
  Filled 2012-11-16: qty 30

## 2012-11-16 MED ORDER — LACTATED RINGERS IV SOLN
INTRAVENOUS | Status: DC
  Administered 2012-11-16 – 2012-11-17 (×3): via INTRAVENOUS

## 2012-11-16 MED ORDER — ONDANSETRON HCL 4 MG/2ML IJ SOLN: 4.0000 mg | Freq: Four times a day (QID) | INTRAMUSCULAR | Status: DC | PRN

## 2012-11-16 MED ORDER — LACTATED RINGERS IV SOLN
500.0000 mL | INTRAVENOUS | Status: DC | PRN
  Administered 2012-11-16: 1000 mL via INTRAVENOUS

## 2012-11-16 MED ORDER — OXYTOCIN BOLUS FROM INFUSION: 500.0000 mL | INTRAVENOUS | Status: DC

## 2012-11-16 MED ORDER — PHENYLEPHRINE 40 MCG/ML (10ML) SYRINGE FOR IV PUSH (FOR BLOOD PRESSURE SUPPORT): 80.0000 ug | PREFILLED_SYRINGE | INTRAVENOUS | Status: DC | PRN

## 2012-11-16 MED ORDER — ACETAMINOPHEN 325 MG PO TABS: 650.0000 mg | ORAL_TABLET | ORAL | Status: DC | PRN

## 2012-11-16 MED ORDER — TERBUTALINE SULFATE 1 MG/ML IJ SOLN: 0.2500 mg | Freq: Once | INTRAMUSCULAR | Status: AC | PRN

## 2012-11-16 MED ORDER — LACTATED RINGERS IV SOLN
500.0000 mL | Freq: Once | INTRAVENOUS | Status: AC
  Administered 2012-11-17: 06:00:00 via INTRAVENOUS

## 2012-11-16 MED ORDER — EPHEDRINE 5 MG/ML INJ: 10.0000 mg | INTRAVENOUS | Status: DC | PRN

## 2012-11-16 MED ORDER — BUTORPHANOL TARTRATE 1 MG/ML IJ SOLN
1.0000 mg | INTRAMUSCULAR | Status: DC | PRN
  Administered 2012-11-16: 1 mg via INTRAVENOUS
  Filled 2012-11-16: qty 1

## 2012-11-16 MED ORDER — OXYTOCIN 40 UNITS IN LACTATED RINGERS INFUSION - SIMPLE MED
1.0000 m[IU]/min | INTRAVENOUS | Status: DC
  Administered 2012-11-16: 2 m[IU]/min via INTRAVENOUS

## 2012-11-16 MED ORDER — OXYCODONE-ACETAMINOPHEN 5-325 MG PO TABS: 1.0000 | ORAL_TABLET | ORAL | Status: DC | PRN

## 2012-11-16 MED ORDER — EPHEDRINE 5 MG/ML INJ
10.0000 mg | INTRAVENOUS | Status: DC | PRN
  Filled 2012-11-16: qty 4

## 2012-11-16 MED ORDER — FENTANYL 2.5 MCG/ML BUPIVACAINE 1/10 % EPIDURAL INFUSION (WH - ANES)
14.0000 mL/h | INTRAMUSCULAR | Status: DC | PRN
  Administered 2012-11-17: 14 mL/h via EPIDURAL
  Filled 2012-11-16: qty 125

## 2012-11-16 MED ORDER — OXYTOCIN 40 UNITS IN LACTATED RINGERS INFUSION - SIMPLE MED
62.5000 mL/h | INTRAVENOUS | Status: DC
  Administered 2012-11-17: 999 mL/h via INTRAVENOUS
  Filled 2012-11-16: qty 1000

## 2012-11-16 MED ORDER — CITRIC ACID-SODIUM CITRATE 334-500 MG/5ML PO SOLN: 30.0000 mL | ORAL | Status: DC | PRN

## 2012-11-16 NOTE — Progress Notes (Addendum)
Dr. Tenny Craw at bedside--orders to d/c pitocin--allow pt to eat dinner and shower--start oral cytotec at 1900

## 2012-11-16 NOTE — H&P (Signed)
Stephanie Herman is a 32 y.o. female presenting for IOL  32 G2P1001 @ 39+0 presents for IOL. Pts pregnancy has been uncomplicated to this point History OB History   Grav Para Term Preterm Abortions TAB SAB Ect Mult Living   5 1 1  2  2   1      Past Medical History  Diagnosis Date  . No pertinent past medical history   . PONV (postoperative nausea and vomiting)   . SVD (spontaneous vaginal delivery)     x 1  . Missed abortion     no surgery required  . Raynaud's disease   . Shortness of breath     sports induced - no inhaler - last episode 6 yrs ago  . Asthma    Past Surgical History  Procedure Laterality Date  . Knee surgery      bilateral  . Wisdom tooth extraction    . Dilation and evacuation  10/25/2011    Procedure: DILATATION AND EVACUATION;  Surgeon: Freddrick March. Tenny Craw, MD;  Location: [facility];  Service: Gynecology;  Laterality: N/A;   Family History: family history includes Arthritis in her father, maternal grandfather, maternal grandmother, and mother; Cancer in her father, maternal grandfather, and maternal grandmother; Heart disease in her paternal grandfather; Hypertension in her father and mother. There is no history of Anesthesia problems. Social History:  reports that she has never smoked. She has never used smokeless tobacco. She reports that she does not drink alcohol or use illicit drugs.   Prenatal Transfer Tool  Maternal Diabetes: No Genetic Screening: Normal Maternal Ultrasounds/Referrals: Normal Fetal Ultrasounds or other Referrals:  None Maternal Substance Abuse:  No Significant Maternal Medications:  None Significant Maternal Lab Results:  None Other Comments:  None  ROS  Dilation: 2 Effacement (%): 70 Station: -1 Exam by:: Dr.Debie Ashline Blood pressure 114/75, pulse 71, temperature 98.2 F (36.8 C), temperature source Oral, resp. rate 18, height 5\' 6"  (1.676 m), weight 87.998 kg (194 lb), last menstrual period 08/15/2011. Exam Physical Exam   Prenatal labs: ABO, Rh: --/--/A POS (10/09 0935) Antibody: NEG (10/09 0935) Rubella:  Immune RPR: NON REACTIVE (10/09 0935)  HBsAg:   Neg HIV:   NR GBS:   Neg  Assessment/Plan: 1) Admit 2) IOL   Nicle Connole H. 11/16/2012, 6:25 PM

## 2012-11-16 NOTE — Progress Notes (Signed)
Pt walking with tele monitors on--tracing maternal HR--monitors adjusted--provider aware

## 2012-11-17 ENCOUNTER — Encounter (HOSPITAL_COMMUNITY): Payer: BC Managed Care – PPO | Admitting: Anesthesiology

## 2012-11-17 ENCOUNTER — Encounter (HOSPITAL_COMMUNITY): Payer: Self-pay | Admitting: *Deleted

## 2012-11-17 ENCOUNTER — Inpatient Hospital Stay (HOSPITAL_COMMUNITY): Payer: BC Managed Care – PPO | Admitting: Anesthesiology

## 2012-11-17 MED ORDER — IBUPROFEN 600 MG PO TABS
600.0000 mg | ORAL_TABLET | Freq: Four times a day (QID) | ORAL | Status: DC
  Administered 2012-11-17: 600 mg via ORAL
  Filled 2012-11-17 (×4): qty 1

## 2012-11-17 MED ORDER — SENNOSIDES-DOCUSATE SODIUM 8.6-50 MG PO TABS
2.0000 | ORAL_TABLET | ORAL | Status: DC
  Filled 2012-11-17 (×2): qty 2

## 2012-11-17 MED ORDER — ONDANSETRON HCL 4 MG/2ML IJ SOLN: 4.0000 mg | INTRAMUSCULAR | Status: DC | PRN

## 2012-11-17 MED ORDER — ZOLPIDEM TARTRATE 5 MG PO TABS: 5.0000 mg | ORAL_TABLET | Freq: Every evening | ORAL | Status: DC | PRN

## 2012-11-17 MED ORDER — DIBUCAINE 1 % RE OINT
1.0000 "application " | TOPICAL_OINTMENT | RECTAL | Status: DC | PRN
  Filled 2012-11-17: qty 28

## 2012-11-17 MED ORDER — OXYCODONE-ACETAMINOPHEN 5-325 MG PO TABS: 1.0000 | ORAL_TABLET | ORAL | Status: DC | PRN

## 2012-11-17 MED ORDER — DIPHENHYDRAMINE HCL 25 MG PO CAPS: 25.0000 mg | ORAL_CAPSULE | Freq: Four times a day (QID) | ORAL | Status: DC | PRN

## 2012-11-17 MED ORDER — BENZOCAINE-MENTHOL 20-0.5 % EX AERO
1.0000 "application " | INHALATION_SPRAY | CUTANEOUS | Status: DC | PRN
  Administered 2012-11-17: 1 via TOPICAL
  Filled 2012-11-17 (×2): qty 56

## 2012-11-17 MED ORDER — METHYLERGONOVINE MALEATE 0.2 MG PO TABS: 0.2000 mg | ORAL_TABLET | ORAL | Status: DC | PRN

## 2012-11-17 MED ORDER — LANOLIN HYDROUS EX OINT: TOPICAL_OINTMENT | CUTANEOUS | Status: DC | PRN

## 2012-11-17 MED ORDER — LIDOCAINE HCL (PF) 1 % IJ SOLN
INTRAMUSCULAR | Status: DC | PRN
  Administered 2012-11-17 (×2): 5 mL

## 2012-11-17 MED ORDER — SIMETHICONE 80 MG PO CHEW: 80.0000 mg | CHEWABLE_TABLET | ORAL | Status: DC | PRN

## 2012-11-17 MED ORDER — WITCH HAZEL-GLYCERIN EX PADS: 1.0000 "application " | MEDICATED_PAD | CUTANEOUS | Status: DC | PRN

## 2012-11-17 MED ORDER — ONDANSETRON HCL 4 MG PO TABS: 4.0000 mg | ORAL_TABLET | ORAL | Status: DC | PRN

## 2012-11-17 MED ORDER — METHYLERGONOVINE MALEATE 0.2 MG/ML IJ SOLN: 0.2000 mg | INTRAMUSCULAR | Status: DC | PRN

## 2012-11-17 MED ORDER — PRENATAL MULTIVITAMIN CH
1.0000 | ORAL_TABLET | Freq: Every day | ORAL | Status: DC
  Administered 2012-11-17 – 2012-11-18 (×2): 1 via ORAL
  Filled 2012-11-17 (×2): qty 1

## 2012-11-17 MED ORDER — TETANUS-DIPHTH-ACELL PERTUSSIS 5-2.5-18.5 LF-MCG/0.5 IM SUSP: 0.5000 mL | Freq: Once | INTRAMUSCULAR | Status: DC

## 2012-11-17 NOTE — Lactation Note (Signed)
This note was copied from the chart of Girl Stuti Solana. Lactation Consultation Note  Patient Name: Girl Mozel Burdett [id number]'M Date: 11/17/2012 Reason for consult: Initial assessment of this second-time mom and her newborn, just 40 hours of age. Mom is almost asleep and room is dark, with baby in crib beside her bed.  Mom denies any breastfeeding concerns and states she breastfed her other child for one year.  RN, Misty Stanley confirms that baby is latching well this evening.   LC provided [employer] brochure and reviewed [facility] services and list of community and web site resources.   Maternal Data Formula Feeding for Exclusion: No Infant to breast within first hour of birth: Yes Does the patient have breastfeeding experience prior to this delivery?: Yes  Feeding Feeding Type: Breast Fed Length of feed: 25 min  LATCH Score/Interventions         Initial LATCh score=9 and baby has fed four times since birth for 25-45 minutes each             Lactation Tools Discussed/Used   Mom sleepy so left information; mom denies problems and is experienced  Consult Status Consult Status: Follow-up Date: 11/18/12 Follow-up type: In-patient    Warrick Parisian [facility] 11/17/2012, 10:48 PM

## 2012-11-17 NOTE — Anesthesia Procedure Notes (Signed)
Epidural Patient location during procedure: OB Start time: 11/17/2012 12:49 AM  Staffing Anesthesiologist: Angus Seller., Harrell Gave. Performed by: anesthesiologist   Preanesthetic Checklist Completed: patient identified, site marked, surgical consent, pre-op evaluation, timeout performed, IV checked, risks and benefits discussed and monitors and equipment checked  Epidural Patient position: sitting Prep: site prepped and draped and DuraPrep Patient monitoring: continuous pulse ox and blood pressure Approach: midline Injection technique: LOR air  Needle:  Needle type: Tuohy  Needle gauge: 17 G Needle length: 9 cm and 9 Needle insertion depth: 5 cm cm Catheter type: closed end flexible Catheter size: 19 Gauge Catheter at skin depth: 10 cm Test dose: negative  Assessment Events: blood not aspirated, injection not painful, no injection resistance, negative IV test and no paresthesia  Additional Notes Patient identified.  Risk benefits discussed including failed block, incomplete pain control, headache, nerve damage, paralysis, blood pressure changes, nausea, vomiting, reactions to medication both toxic or allergic, and postpartum back pain.  Patient expressed understanding and wished to proceed.  All questions were answered.  Sterile technique used throughout procedure and epidural site dressed with sterile barrier dressing. No paresthesia or other complications noted.The patient did not experience any signs of intravascular injection such as tinnitus or metallic taste in mouth nor signs of intrathecal spread such as rapid motor block. Please see nursing notes for vital signs.

## 2012-11-17 NOTE — Anesthesia Preprocedure Evaluation (Signed)
Anesthesia Evaluation  Patient identified by MRN, date of birth, ID band Patient awake    Reviewed: Allergy & Precautions, H&P , Patient's Chart, lab work & pertinent test results  History of Anesthesia Complications (+) PONV and history of anesthetic complications  Airway Mallampati: II TM Distance: >3 FB Neck ROM: full    Dental no notable dental hx.    Pulmonary neg pulmonary ROS, asthma ,  breath sounds clear to auscultation  Pulmonary exam normal       Cardiovascular negative cardio ROS  Rhythm:regular Rate:Normal     Neuro/Psych negative neurological ROS  negative psych ROS   GI/Hepatic negative GI ROS, Neg liver ROS,   Endo/Other  negative endocrine ROS  Renal/GU negative Renal ROS     Musculoskeletal   Abdominal   Peds  Hematology negative hematology ROS (+)   Anesthesia Other Findings   Reproductive/Obstetrics (+) Pregnancy                           Anesthesia Physical Anesthesia Plan  ASA: II  Anesthesia Plan: Epidural   Post-op Pain Management:    Induction:   Airway Management Planned:   Additional Equipment:   Intra-op Plan:   Post-operative Plan:   Informed Consent: I have reviewed the patients History and Physical, chart, labs and discussed the procedure including the risks, benefits and alternatives for the proposed anesthesia with the patient or authorized representative who has indicated his/her understanding and acceptance.     Plan Discussed with:   Anesthesia Plan Comments:        patient states that allergic response to lidocaine is nausea and vomiting and only exhibited after iv dosing general anesthesia. i explained that this was likely due to narcotics that she has listed as allergies but that she could have similar reactions from epidural medications. Anesthesia Quick Evaluation

## 2012-11-18 LAB — CBC
HCT: 38.4 % (ref 36.0–46.0)
Hemoglobin: 13.3 g/dL (ref 12.0–15.0)
MCHC: 34.6 g/dL (ref 30.0–36.0)
Platelets: 112 10*3/uL — ABNORMAL LOW (ref 150–400)

## 2012-11-18 NOTE — Progress Notes (Signed)
Patient is eating, ambulating, voiding.  Pain control is good.  Filed Vitals:   11/17/12 1100 11/17/12 1454 11/17/12 2100 11/18/12 0646  BP: 116/69 115/71 115/70 127/83  Pulse: 55 50 57 52  Temp: 98.5 F (36.9 C) 97.6 F (36.4 C) 98.3 F (36.8 C) 97.8 F (36.6 C)  TempSrc: Oral Oral Oral Oral  Resp: 18 18 16 18   Height:      Weight:      SpO2:    95%    Fundus firm Perineum without swelling.  Lab Results  Component Value Date   WBC 12.6* 11/18/2012   HGB 13.3 11/18/2012   HCT 38.4 11/18/2012   MCV 92.8 11/18/2012   PLT 112* 11/18/2012    --/--/A POS (10/09 0935)/RI  A/P Post partum day 1.  Routine care.  Expect d/c per plan.    Nitish Roes A

## 2012-11-18 NOTE — Discharge Summary (Signed)
Obstetric Discharge Summary Reason for Admission: induction of labor Prenatal Procedures: none Intrapartum Procedures: spontaneous vaginal delivery Postpartum Procedures: none Complications-Operative and Postpartum: none Hemoglobin  Date Value Range Status  11/18/2012 13.3  12.0 - 15.0 g/dL Final     HCT  Date Value Range Status  11/18/2012 38.4  36.0 - 46.0 % Final    Discharge Diagnoses: Term Pregnancy-delivered  Discharge Information: Date: 11/18/2012 Activity: pelvic rest Diet: routine Medications: Ibuprofen Condition: stable Instructions: refer to practice specific booklet Discharge to: home Follow-up Information   Follow up with Almon Hercules., MD.   Specialty:  Obstetrics and Gynecology   Contact information:   [address] [place] [address] [phone]       Newborn Data: Live born female  Birth Weight: 8 lb 3.2 oz (3720 g) APGAR: 8, 9  Home with mother.  Deyci Gesell A 11/18/2012, 10:03 AM

## 2012-11-19 NOTE — Progress Notes (Signed)
Patient is eating, ambulating, voiding.  Pain control is good.  Filed Vitals:   11/17/12 2100 11/18/12 0646 11/18/12 1816 11/19/12 0605  BP: 115/70 127/83 136/82 118/69  Pulse: 57 52 50 50  Temp: 98.3 F (36.8 C) 97.8 F (36.6 C) 98 F (36.7 C) 97.8 F (36.6 C)  TempSrc: Oral Oral Oral Oral  Resp: 16 18 18 17   Height:      Weight:      SpO2:  95%      Fundus firm Perineum without swelling.  Lab Results  Component Value Date   WBC 12.6* 11/18/2012   HGB 13.3 11/18/2012   HCT 38.4 11/18/2012   MCV 92.8 11/18/2012   PLT 112* 11/18/2012    --/--/A POS (10/09 0935)/RI  A/P Post partum day 2.  Routine care.  Expect d/c today.    Ivis Nicolson A

## 2012-11-24 NOTE — Progress Notes (Signed)
Post discharge chart review completed.  

## 2012-11-26 NOTE — Anesthesia Postprocedure Evaluation (Signed)
  Anesthesia Post Note  Patient: Stephanie Herman  Procedure(s) Performed: * No procedures listed *  Anesthesia type: Epidural  Patient location: Mother/Baby  Post pain: Pain level controlled  Post assessment: Post-op Vital signs reviewed  Last Vitals: There were no vitals filed for this visit.  Post vital signs: Reviewed  Level of consciousness: awake  Complications: No apparent anesthesia complications per chart review

## 2013-04-08 ENCOUNTER — Emergency Department (HOSPITAL_COMMUNITY)
Admission: EM | Admit: 2013-04-08 | Discharge: 2013-04-08 | Disposition: A | Discharge status: Home / Self Care | Payer: BC Managed Care – PPO | Attending: Emergency Medicine | Admitting: Emergency Medicine

## 2013-04-08 ENCOUNTER — Encounter (HOSPITAL_COMMUNITY): Payer: Self-pay | Admitting: Emergency Medicine

## 2013-04-08 DIAGNOSIS — Z79899 Other long term (current) drug therapy: Secondary | ICD-10-CM | POA: Insufficient documentation

## 2013-04-08 DIAGNOSIS — R109 Unspecified abdominal pain: Secondary | ICD-10-CM | POA: Insufficient documentation

## 2013-04-08 DIAGNOSIS — J45909 Unspecified asthma, uncomplicated: Secondary | ICD-10-CM | POA: Insufficient documentation

## 2013-04-08 DIAGNOSIS — R197 Diarrhea, unspecified: Secondary | ICD-10-CM | POA: Insufficient documentation

## 2013-04-08 DIAGNOSIS — R112 Nausea with vomiting, unspecified: Secondary | ICD-10-CM

## 2013-04-08 DIAGNOSIS — E860 Dehydration: Secondary | ICD-10-CM | POA: Insufficient documentation

## 2013-04-08 DIAGNOSIS — Z3202 Encounter for pregnancy test, result negative: Secondary | ICD-10-CM | POA: Insufficient documentation

## 2013-04-08 LAB — URINALYSIS, ROUTINE W REFLEX MICROSCOPIC
Glucose, UA: NEGATIVE mg/dL
HGB URINE DIPSTICK: NEGATIVE
Ketones, ur: 15 mg/dL — AB
Leukocytes, UA: NEGATIVE
Nitrite: NEGATIVE
Protein, ur: NEGATIVE mg/dL
SPECIFIC GRAVITY, URINE: 1.025 (ref 1.005–1.030)
Urobilinogen, UA: 0.2 mg/dL (ref 0.0–1.0)
pH: 5.5 (ref 5.0–8.0)

## 2013-04-08 LAB — I-STAT CHEM 8, ED
BUN: 18 mg/dL (ref 6–23)
CALCIUM ION: 1.18 mmol/L (ref 1.12–1.23)
CHLORIDE: 107 meq/L (ref 96–112)
Creatinine, Ser: 0.9 mg/dL (ref 0.50–1.10)
GLUCOSE: 92 mg/dL (ref 70–99)
HCT: 48 % — ABNORMAL HIGH (ref 36.0–46.0)
Hemoglobin: 16.3 g/dL — ABNORMAL HIGH (ref 12.0–15.0)
Potassium: 3.6 mEq/L — ABNORMAL LOW (ref 3.7–5.3)
Sodium: 140 mEq/L (ref 137–147)
TCO2: 22 mmol/L (ref 0–100)

## 2013-04-08 LAB — POC URINE PREG, ED: Preg Test, Ur: NEGATIVE

## 2013-04-08 MED ORDER — ONDANSETRON HCL 4 MG PO TABS: 4.0000 mg | ORAL_TABLET | Freq: Three times a day (TID) | ORAL | Status: DC | PRN

## 2013-04-08 MED ORDER — ONDANSETRON 4 MG PO TBDP
8.0000 mg | ORAL_TABLET | Freq: Once | ORAL | Status: AC
  Administered 2013-04-08: 8 mg via ORAL
  Filled 2013-04-08: qty 2

## 2013-04-08 MED ORDER — SODIUM CHLORIDE 0.9 % IV BOLUS (SEPSIS)
1000.0000 mL | Freq: Once | INTRAVENOUS | Status: AC
  Administered 2013-04-08: 1000 mL via INTRAVENOUS

## 2013-04-08 MED ORDER — ONDANSETRON HCL 4 MG/2ML IJ SOLN
4.0000 mg | Freq: Once | INTRAMUSCULAR | Status: AC
Start: 2013-04-08 — End: 2013-04-08
  Administered 2013-04-08: 4 mg via INTRAVENOUS
  Filled 2013-04-08: qty 2

## 2013-04-08 MED ORDER — LOPERAMIDE HCL 2 MG PO CAPS: 2.0000 mg | ORAL_CAPSULE | Freq: Four times a day (QID) | ORAL | Status: DC | PRN

## 2013-04-08 NOTE — Discharge Instructions (Signed)
Take nausea and diarrhea medications as directed. Follow up with your doctor in 2 days to ensure resolution of symptoms. Return to emergency department if you develop any worsening symptoms.    Diarrhea Diarrhea is watery poop (stool). It can make you feel weak, tired, thirsty, or give you a dry mouth (signs of dehydration). Watery poop is a sign of another problem, most often an infection. It often lasts 2 3 days. It can last longer if it is a sign of something serious. Take care of yourself as told by your doctor. HOME CARE   Drink 1 cup (8 ounces) of fluid each time you have watery poop.  Do not drink the following fluids:  Those that contain simple sugars (fructose, glucose, galactose, lactose, sucrose, maltose).  Sports drinks.  Fruit juices.  Whole milk products.  Sodas.  Drinks with caffeine (coffee, tea, soda) or alcohol.  Oral rehydration solution may be used if the doctor says it is okay. You may make your own solution. Follow this recipe:    teaspoon table salt.   teaspoon baking soda.   teaspoon salt substitute containing potassium chloride.  1 tablespoons sugar.  1 liter (34 ounces) of water.  Avoid the following foods:  High fiber foods, such as raw fruits and vegetables.  Nuts, seeds, and whole grain breads and cereals.   Those that are sweetened with sugar alcohols (xylitol, sorbitol, mannitol).  Try eating the following foods:  Starchy foods, such as rice, toast, pasta, low-sugar cereal, oatmeal, baked potatoes, crackers, and bagels.  Bananas.  Applesauce.  Eat probiotic-rich foods, such as yogurt and milk products that are fermented.  Wash your hands well after each time you have watery poop.  Only take medicine as told by your doctor.  Take a warm bath to help lessen burning or pain from having watery poop. GET HELP RIGHT AWAY IF:   You cannot drink fluids without throwing up (vomiting).  You keep throwing up.  You have blood in your  poop, or your poop looks black and tarry.  You do not pee (urinate) in 6 8 hours, or there is only a small amount of very dark pee.  You have belly (abdominal) pain that gets worse or stays in the same spot (localizes).  You are weak, dizzy, confused, or lightheaded.  You have a very bad headache.  Your watery poop gets worse or does not get better.  You have a fever or lasting symptoms for more than 2 3 days.  You have a fever and your symptoms suddenly get worse. MAKE SURE YOU:   Understand these instructions.  Will watch your condition.  Will get help right away if you are not doing well or get worse. Document Released: 07/14/2007 Document Revised: 10/20/2011 Document Reviewed: 10/03/2011 [facility] Patient Information 2014 [employer] [place].

## 2013-04-08 NOTE — ED Provider Notes (Signed)
Patient received from Earley FavorGail Schulz, NP at shift change. Patient feeling better after IV fluids. Labs consistent with dehydration. Patient concerned for dehydration because she is breastfeeding and worried about producing milk. Patient requests treatment for diarrhea.   Patient hypotensive on admission. Vitals normalized with IV fluids. Plan to treat patient's symptoms and have follow up with PCP in 2 days. Advised to return should she develop any worsening symptoms, abdominal pain, fever/chills, or unable to tolerate oral fluids.   Allen NorrisJacob Gray LevasyLackey, PA-C 04/08/13 [phone]

## 2013-04-08 NOTE — ED Notes (Signed)
Patient has been experiencing diarrhea since Wednesday, however tonight it got worse. Patient reports going to restroom "every 5-10 minutes." Patient is concerned she is dehydrated and she is breast feeding. Patient says she is also experiencing 3/10 lower abdominal pain.

## 2013-04-08 NOTE — ED Notes (Signed)
Patient informed a urine sample is needed, states she cannot go at this time.

## 2013-04-08 NOTE — ED Provider Notes (Signed)
CSN: [id number]     Arrival date & time 04/08/13  0344 History   First MD Initiated Contact with Patient 04/08/13 0434     Chief Complaint  Patient presents with  . Diarrhea     (Consider location/radiation/quality/duration/timing/severity/associated sxs/prior Treatment) HPI Comments: Patient with waxing and waning degrees of diarrhea over the past 4 days tonight worse  Associated with nausea no vomiting  She is currently nursing and feeling like she can not keep up with her fluids   Patient is a 33 y.o. female presenting with diarrhea. The history is provided by the patient.  Diarrhea Quality:  Watery Onset quality:  Gradual Timing:  Intermittent Progression:  Worsening Ineffective treatments:  None tried Associated symptoms: abdominal pain   Associated symptoms: no chills, no fever and no vomiting   Abdominal pain:    Location:  Generalized   Quality:  Cramping   Severity:  Moderate   Onset quality:  Gradual   Duration:  4 days   Timing:  Intermittent   Progression:  Worsening   Chronicity:  New Risk factors: no recent antibiotic use, no sick contacts, no suspicious food intake and no travel to endemic areas     Past Medical History  Diagnosis Date  . No pertinent past medical history   . PONV (postoperative nausea and vomiting)   . SVD (spontaneous vaginal delivery)     x 1  . Missed abortion     no surgery required  . Raynaud's disease   . Shortness of breath     sports induced - no inhaler - last episode 6 yrs ago  . Asthma    Past Surgical History  Procedure Laterality Date  . Knee surgery      bilateral  . Wisdom tooth extraction    . Dilation and evacuation  10/25/2011    Procedure: DILATATION AND EVACUATION;  Surgeon: Freddrick March. Tenny Craw, MD;  Location: [facility];  Service: Gynecology;  Laterality: N/A;   Family History  Problem Relation Age of Onset  . Anesthesia problems Neg Hx   . Arthritis Mother   . Hypertension Mother   . Arthritis Father   . Cancer  Father   . Hypertension Father   . Arthritis Maternal Grandmother   . Cancer Maternal Grandmother   . Arthritis Maternal Grandfather   . Cancer Maternal Grandfather   . Heart disease Paternal Grandfather    History  Substance Use Topics  . Smoking status: Never Smoker   . Smokeless tobacco: Never Used  . Alcohol Use: No   OB History   Grav Para Term Preterm Abortions TAB SAB Ect Mult Living   5 2 2  2  2   2      Review of Systems  Constitutional: Negative for fever and chills.  Gastrointestinal: Positive for nausea, abdominal pain and diarrhea. Negative for vomiting.  All other systems reviewed and are negative.      Allergies  Codeine; Lidocaine; and Morphine and related  Home Medications   Current Outpatient Rx  Name  Route  Sig  Dispense  Refill  . Prenatal Vit-Fe Fumarate-FA (PRENATAL MULTIVITAMIN) TABS   Oral   Take 1 tablet by mouth every morning.          BP 110/54  Pulse 50  Temp(Src) 98.3 F (36.8 C) (Oral)  Resp 16  SpO2 99% Physical Exam  Constitutional: She appears well-developed.  HENT:  Head: Normocephalic.  Eyes: Pupils are equal, round, and reactive to light.  Neck:  Normal range of motion.  Cardiovascular: Normal rate and regular rhythm.   Pulmonary/Chest: Effort normal.  Abdominal: Soft. Bowel sounds are normal. She exhibits no distension. There is no tenderness.  Musculoskeletal: Normal range of motion.  Neurological: She is alert.  Skin: Skin is warm and dry.    ED Course  Procedures (including critical care time) Labs Review Labs Reviewed  I-STAT CHEM 8, ED - Abnormal; Notable for the following:    Potassium 3.6 (*)    Hemoglobin 16.3 (*)    HCT 48.0 (*)    All other components within normal limits  URINALYSIS, ROUTINE W REFLEX MICROSCOPIC  POC URINE PREG, ED   Imaging Review No results found.   EKG Interpretation None     After 2 Liters IV fluids feeling better awaiting UA  If normal will DC home with Zofran  MDM    Final diagnoses:  None         Arman FilterGail K Jeffre Enriques, NP 04/08/13 [phone]

## 2013-04-09 NOTE — ED Provider Notes (Signed)
Medical screening examination/treatment/procedure(s) were performed by non-physician practitioner and as supervising physician I was immediately available for consultation/collaboration.   EKG Interpretation None        Joya Gaskinsonald W Myelle Poteat, MD 04/09/13 [phone]

## 2013-04-09 NOTE — ED Provider Notes (Signed)
Medical screening examination/treatment/procedure(s) were performed by non-physician practitioner and as supervising physician I was immediately available for consultation/collaboration.   EKG Interpretation None        Joya Gaskinsonald W Dantonio Justen, MD 04/09/13 [phone]

## 2013-06-26 ENCOUNTER — Telehealth: Payer: Self-pay | Admitting: Hematology & Oncology

## 2013-06-26 NOTE — Telephone Encounter (Signed)
Spoke w NEW PATIENT today to remind them of their appointment with Dr. Ennever. Also, advised them to bring all medication bottles and insurance card information. ° °

## 2013-06-29 ENCOUNTER — Other Ambulatory Visit (HOSPITAL_BASED_OUTPATIENT_CLINIC_OR_DEPARTMENT_OTHER): Payer: BC Managed Care – PPO | Admitting: Lab

## 2013-06-29 ENCOUNTER — Ambulatory Visit: Payer: BC Managed Care – PPO

## 2013-06-29 ENCOUNTER — Ambulatory Visit (HOSPITAL_BASED_OUTPATIENT_CLINIC_OR_DEPARTMENT_OTHER): Payer: BC Managed Care – PPO | Admitting: Hematology & Oncology

## 2013-06-29 ENCOUNTER — Encounter: Payer: Self-pay | Admitting: Hematology & Oncology

## 2013-06-29 VITALS — BP 124/72 | HR 51 | Temp 97.1°F | Resp 12 | Ht 65.0 in | Wt 155.0 lb

## 2013-06-29 DIAGNOSIS — D696 Thrombocytopenia, unspecified: Secondary | ICD-10-CM

## 2013-06-29 LAB — CBC WITH DIFFERENTIAL (CANCER CENTER ONLY)
BASO#: 0 10*3/uL (ref 0.0–0.2)
BASO%: 0.2 % (ref 0.0–2.0)
EOS%: 1 % (ref 0.0–7.0)
Eosinophils Absolute: 0.1 10*3/uL (ref 0.0–0.5)
HCT: 43.5 % (ref 34.8–46.6)
HEMOGLOBIN: 14.8 g/dL (ref 11.6–15.9)
LYMPH#: 2.5 10*3/uL (ref 0.9–3.3)
LYMPH%: 40.2 % (ref 14.0–48.0)
MCH: 32 pg (ref 26.0–34.0)
MCHC: 34 g/dL (ref 32.0–36.0)
MCV: 94 fL (ref 81–101)
MONO#: 0.6 10*3/uL (ref 0.1–0.9)
MONO%: 9.2 % (ref 0.0–13.0)
NEUT#: 3 10*3/uL (ref 1.5–6.5)
NEUT%: 49.4 % (ref 39.6–80.0)
Platelets: 151 10*3/uL (ref 145–400)
RBC: 4.62 10*6/uL (ref 3.70–5.32)
RDW: 12.9 % (ref 11.1–15.7)
WBC: 6.1 10*3/uL (ref 3.9–10.0)

## 2013-06-29 LAB — CHCC SATELLITE - SMEAR

## 2013-07-01 NOTE — Progress Notes (Signed)
Referral MD  Reason for Referral: Thrombocytopenia   Chief Complaint  Patient presents with  . New pt visit  : My platelets are low  HPI: Stephanie Herman is a really nice 33 year old Caucasian female. She is originally from [place]. She and her husband have been in town for about 4 years or so.  She has 2 children. Her second child was born at the about 7 months ago.  She has been noted to have some thrombocytopenia. She sees Dr. Luciana Axe. She was found to have a platelet count of 138,000 back in February of this year. She had a normal white cell count of 7.9. Hemoglobin was 15.4. Her MCV was 93.  The CBC was repeated  in March. Her platelet count was 130,000. She had a normal white cell count. Hemoglobin was 15. She had a normal white cell differential.  She was refered to the [facility] for [place] to evaluate her.  She's had no bleeding or bruising. She had no problems with her delivery of her second child. There is no bleeding. I would suspect that her platelet count was checked during her pregnancy and nothing was found to be unusual.  She's had no weight loss or weight gain. She is not a vegetarian. She has had no fever or sweats. There's been no change in bowel or bladder habits. She's not yet had a monthly cycle. She is breast-feeding.   She's had no change in medications. She really is not on much in the way of medications.     Past Medical History  Diagnosis Date  . No pertinent past medical history   . PONV (postoperative nausea and vomiting)   . SVD (spontaneous vaginal delivery)     x 1  . Missed abortion     no surgery required  . Raynaud's disease   . Shortness of breath     sports induced - no inhaler - last episode 6 yrs ago  . Asthma   :  Past Surgical History  Procedure Laterality Date  . Knee surgery      bilateral  . Wisdom tooth extraction    . Dilation and evacuation  10/25/2011    Procedure: DILATATION AND EVACUATION;  Surgeon: Farrel Gobble. Harrington Challenger, MD;   Location: [facility];  Service: Gynecology;  Laterality: N/A;  :  Current outpatient prescriptions:Prenatal Vit-Fe Fumarate-FA (PRENATAL MULTIVITAMIN) TABS, Take 1 tablet by mouth every morning., Disp: , Rfl: ;  loperamide (IMODIUM) 2 MG capsule, Take 1 capsule (2 mg total) by mouth 4 (four) times daily as needed for diarrhea or loose stools., Disp: 12 capsule, Rfl: 0 ondansetron (ZOFRAN) 4 MG tablet, Take 1 tablet (4 mg total) by mouth every 8 (eight) hours as needed for nausea or vomiting., Disp: 10 tablet, Rfl: 0:  :  Allergies  Allergen Reactions  . Codeine Other (See Comments)    Body goes into shock  . Lidocaine Other (See Comments)    Pt states Liquid Anesthesia makes her violently sick.  "I can tolerate it on my skin and by injection"  . Morphine And Related Other (See Comments)    Body goes into shock  :  Family History  Problem Relation Age of Onset  . Anesthesia problems Neg Hx   . Arthritis Mother   . Hypertension Mother   . Arthritis Father   . Cancer Father   . Hypertension Father   . Arthritis Maternal Grandmother   . Cancer Maternal Grandmother   . Arthritis Maternal Grandfather   .  Cancer Maternal Grandfather   . Heart disease Paternal Grandfather   :  History   Social History  . Marital Status: Married    Spouse Name: N/A    Number of Children: N/A  . Years of Education: N/A   Occupational History  . Not on file.   Social History Main Topics  . Smoking status: Never Smoker   . Smokeless tobacco: Never Used  . Alcohol Use: No  . Drug Use: No  . Sexual Activity: Yes    Birth Control/ Protection: None   Other Topics Concern  . Not on file   Social History Narrative  . No narrative on file  :  Pertinent items are noted in HPI.  Exam: @IPVITALS @  well-developed and well-nourished white female in no obvious distress. Vital signs are temperature of 97.1. Pulse 51. Blood pressure 124/72. Weight is 155 pounds. Head and neck exam shows no ocular or  oral lesions. There is no scleral icterus. There's no adenopathy in the neck. Lungs are clear. Cardiac exam regular rate and rhythm with no murmurs rubs or bruits. Abdomen is soft. Good bowel sounds. There is no fluid wave. There is no palpable liver or spleen tip. Back exam no tenderness over the spine ribs or hips. Extremities shows no clubbing cyanosis or edema. Skin exam no rashes ecchymoses or petechia. Neurological exam is nonfocal.    Recent Labs  06/29/13 1505  WBC 6.1  HGB 14.8  HCT 43.5  PLT 151 Platelet count consistent in citrate   No results found for this basename: NA, K, CL, CO2, GLUCOSE, BUN, CREATININE, CALCIUM,  in the last 72 hours  Blood smear review: Normochromic and normocytic red blood cells. There are no nucleated red blood cells. As you know teardrop cells. There are no schistocytes. I see no rouleau formation. White cells per normal in morphology maturation. There are no hyper pseudomenopause. I see no immature myeloid lymphoid forms. There are no atypical lymphocytes. Platelets are adequate in number and size. Platelets are well granulated   Pathology: No data     Assessment and Plan:  Stephanie Herman is a 33 year old Caucasian female with minimal thrombocytopenia. This was transient. Her platelet count now is normal. She has a relatively benign blood smear. I cannot find anything on her physical exam that would be pointing to an issue.  Her blood smear, getting, is very normal.  I don't know if this transient thrombocytopenia could have been reflective of the pregnancy. I would think that she would've had thrombocytopenia during her pregnancy but I don't find any mention of this.  I don't see anything with suggest a collagen vascular disease. I will see any thing that would suggest a bone marrow issue.  She is very very nice. She came in with her2 daughters. I spent a good 45 minutes with her. I reviewed her lab work with her.  I don't see any need for [place] to  see her back in the clinic. Again, she is very nice. She is very athletic. She does triathlons.  I will be more than happy to see her back in the future if any problems arise. She and husband are not planning on having any more children.

## 2013-12-10 ENCOUNTER — Encounter: Payer: Self-pay | Admitting: Hematology & Oncology

## 2014-03-28 DIAGNOSIS — R001 Bradycardia, unspecified: Secondary | ICD-10-CM | POA: Insufficient documentation

## 2014-04-11 DIAGNOSIS — I499 Cardiac arrhythmia, unspecified: Secondary | ICD-10-CM | POA: Insufficient documentation

## 2014-05-09 ENCOUNTER — Encounter: Payer: Self-pay | Admitting: Family Medicine

## 2014-05-09 ENCOUNTER — Ambulatory Visit (INDEPENDENT_AMBULATORY_CARE_PROVIDER_SITE_OTHER): Payer: BLUE CROSS/BLUE SHIELD | Admitting: Family Medicine

## 2014-05-09 VITALS — Ht 66.0 in | Wt 148.0 lb

## 2014-05-09 DIAGNOSIS — Z713 Dietary counseling and surveillance: Secondary | ICD-10-CM

## 2014-05-09 NOTE — Progress Notes (Signed)
Medical Nutrition Therapy:  Appt start time: 1330 end time:  1430.  Assessment:  Primary concerns today: Weight management and sports nutrition.  Stephanie Herman wants to learn best way to eat to manage wt and optimize performance.  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    Learning Readiness: Change in progress  Usual eating pattern includes 3 meals and 2 snacks per day. Frequent foods and beverages include 2 c half-caf coffee/day w/ milk, 1-2 c blk/grn tea, 1 c whole milk, bkfst of 2 eggs, cooked oat bran, spinach.  Avoided foods include popcorn (braces), processed grains, most processed foods, fast foods.   Usual physical activity includes training for triathlon:  Sunday off; M 90-min bike & 30-min run, T Th ~3600 yd swim & 60-min wt training, W F 8-mi (60+ min) run, Sa 35-40 mi bike.    24-hr recall: (Up at 6 AM) B (7 AM)-   2 scrmbld eggs, cooked oat bran (1/2 c dry), 1 tbsp flax meal, 2 c coffee, ~1/4 c whl milk  Snk (11 AM)-   4 oz salmon, 1 avocado, water L (3 PM)-  1 c raspberries, [employer] pro shake (20 g pro, 195 kcal) in water Snk ( PM)-  --- D (5 PM)-  Lamb shank, 2 c spinach Snk (8 PM)-  1 c whole milk, 2 tbsp peanutbutter Typical day? Yes.    Progress Towards Goal(s):  In progress.   Nutritional Diagnosis:  NI-5.8.1 Inadequate carbohydrate intake As related to energy expenditure.  As evidenced by frequent meals with no carb source in spite of demanding exercise regimen.    Intervention:  Nutrition education.  Handouts given during visit include:  AVS  Recipes  Demonstrated degree of understanding via:  Teach Back   Monitoring/Evaluation:  Dietary intake, exercise, and body weight prn.

## 2014-05-09 NOTE — Patient Instructions (Addendum)
-   Increase veg's:  Aim for including at both lunch and dinner.   - Include at each meal:  A protein source, carb food, and veg's (fruit if breakfast).    - If you are cautious about carb's (including high-glycemic fruits), include these foods immediately pre- and/or post-exercise.     - High-glycemic fruits: banana, watermelon, pineapple, most tropical fruits, grapes, (oranges).    - Best carb sources:  Whole grains (rice, quinoa, barley, rye, oats), sweet potato, white potatoes (chill overnight; serve with acidic ingred- lowers glycemic response),      beets, high-fiber cereals, winter squash, whole grain pasta.    - Pro-stat sports drink:  10 oz WF 365 lemonade, 2 tbsp (1 oz) Pro-stat; dilute up to 20-22 oz with water.    - Measuring glycogen reserves with ultrasound:  [facility], [place]([address] - Ferritin (storage form of iron) levels:  You want this to be WELL inside of normal limits, not low-normal.

## 2014-06-05 ENCOUNTER — Emergency Department (HOSPITAL_COMMUNITY): Payer: BLUE CROSS/BLUE SHIELD

## 2014-06-05 ENCOUNTER — Emergency Department (HOSPITAL_COMMUNITY)
Admission: EM | Admit: 2014-06-05 | Discharge: 2014-06-05 | Disposition: A | Discharge status: Home / Self Care | Payer: BLUE CROSS/BLUE SHIELD | Attending: Emergency Medicine | Admitting: Emergency Medicine

## 2014-06-05 ENCOUNTER — Encounter (HOSPITAL_COMMUNITY): Payer: Self-pay | Admitting: Emergency Medicine

## 2014-06-05 DIAGNOSIS — J45909 Unspecified asthma, uncomplicated: Secondary | ICD-10-CM | POA: Diagnosis not present

## 2014-06-05 DIAGNOSIS — S0990XA Unspecified injury of head, initial encounter: Secondary | ICD-10-CM | POA: Diagnosis not present

## 2014-06-05 DIAGNOSIS — Y999 Unspecified external cause status: Secondary | ICD-10-CM | POA: Insufficient documentation

## 2014-06-05 DIAGNOSIS — S3992XA Unspecified injury of lower back, initial encounter: Secondary | ICD-10-CM | POA: Diagnosis present

## 2014-06-05 DIAGNOSIS — Z8679 Personal history of other diseases of the circulatory system: Secondary | ICD-10-CM | POA: Diagnosis not present

## 2014-06-05 DIAGNOSIS — T148XXA Other injury of unspecified body region, initial encounter: Secondary | ICD-10-CM

## 2014-06-05 DIAGNOSIS — S39012A Strain of muscle, fascia and tendon of lower back, initial encounter: Secondary | ICD-10-CM | POA: Diagnosis not present

## 2014-06-05 DIAGNOSIS — Y92410 Unspecified street and highway as the place of occurrence of the external cause: Secondary | ICD-10-CM | POA: Diagnosis not present

## 2014-06-05 DIAGNOSIS — Y9389 Activity, other specified: Secondary | ICD-10-CM | POA: Diagnosis not present

## 2014-06-05 DIAGNOSIS — S299XXA Unspecified injury of thorax, initial encounter: Secondary | ICD-10-CM | POA: Diagnosis not present

## 2014-06-05 DIAGNOSIS — Z3202 Encounter for pregnancy test, result negative: Secondary | ICD-10-CM | POA: Diagnosis not present

## 2014-06-05 LAB — POC URINE PREG, ED: Preg Test, Ur: NEGATIVE
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
## 2014-06-05 NOTE — ED Notes (Signed)
Pt back from X-ray.  

## 2014-06-05 NOTE — ED Notes (Signed)
Patient states is unable to provide urine sample at this time
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
## 2014-06-05 NOTE — ED Notes (Signed)
Patient to be brought from PEDS, once the patient's child is discharged.

## 2014-06-05 NOTE — ED Notes (Addendum)
Pt comfortable with discharge and follow up instructions. Pt declines wheelchair, escorted to waiting area by this RN. No prescriptions. 

## 2014-06-05 NOTE — Discharge Instructions (Signed)
Please call your doctor for a followup appointment within 24-48 hours. When you talk to your doctor please let them know that you were seen in the emergency department and have them acquire all of your records so that they can discuss the findings with you and formulate a treatment plan to fully care for your new and ongoing problems. Please follow-up with primary care provider Please follow-up with orthopedics Please rest and stay hydrated Please avoid any physical strenuous activity Please apply heat and massage with icy hot ointment Please continue to monitor symptoms closely and if symptoms are to worsen or change (fever greater than 101, chills, sweating, nausea, vomiting, chest pain, shortness of breathe, difficulty breathing, weakness, numbness, tingling, worsening or changes to pain pattern, worsening back pain, fall, injury, inability keep food or fluids down, inability to control urine or bowel movements, loss of sensation, blurred vision, neck pain, neck stiffness) please report back to the Emergency Department immediately.   Muscle Strain A muscle strain is an injury that occurs when a muscle is stretched beyond its normal length. Usually a small number of muscle fibers are torn when this happens. Muscle strain is rated in degrees. First-degree strains have the least amount of muscle fiber tearing and pain. Second-degree and third-degree strains have increasingly more tearing and pain.  Usually, recovery from muscle strain takes 1-2 weeks. Complete healing takes 5-6 weeks.  CAUSES  Muscle strain happens when a sudden, violent force placed on a muscle stretches it too far. This may occur with lifting, sports, or a fall.  RISK FACTORS Muscle strain is especially common in athletes.  SIGNS AND SYMPTOMS At the site of the muscle strain, there may be:  Pain.  Bruising.  Swelling.  Difficulty using the muscle due to pain or lack of normal function. DIAGNOSIS  Your health care provider  will perform a physical exam and ask about your medical history. TREATMENT  Often, the best treatment for a muscle strain is resting, icing, and applying cold compresses to the injured area.  HOME CARE INSTRUCTIONS   Use the PRICE method of treatment to promote muscle healing during the first 2-3 days after your injury. The PRICE method involves:  Protecting the muscle from being injured again.  Restricting your activity and resting the injured body part.  Icing your injury. To do this, put ice in a plastic bag. Place a towel between your skin and the bag. Then, apply the ice and leave it on from 15-20 minutes each hour. After the third day, switch to moist heat packs.  Apply compression to the injured area with a splint or elastic bandage. Be careful not to wrap it too tightly. This may interfere with blood circulation or increase swelling.  Elevate the injured body part above the level of your heart as often as you can.  Only take over-the-counter or prescription medicines for pain, discomfort, or fever as directed by your health care provider.  Warming up prior to exercise helps to prevent future muscle strains. SEEK MEDICAL CARE IF:   You have increasing pain or swelling in the injured area.  You have numbness, tingling, or a significant loss of strength in the injured area. MAKE SURE YOU:   Understand these instructions.  Will watch your condition.  Will get help right away if you are not doing well or get worse. Document Released: 01/25/2005 Document Revised: 11/15/2012 Document Reviewed: 08/24/2012 [facility] Patient Information 2015 [employer] [place]. This information is not intended to replace advice given to  you by your health care provider. Make sure you discuss any questions you have with your health care provider.

## 2015-12-25 ENCOUNTER — Other Ambulatory Visit: Payer: Self-pay | Admitting: Obstetrics and Gynecology

## 2015-12-26 LAB — CYTOLOGY - PAP

## 2017-01-02 ENCOUNTER — Ambulatory Visit (HOSPITAL_COMMUNITY)
Admission: EM | Admit: 2017-01-02 | Discharge: 2017-01-02 | Disposition: A | Discharge status: Home / Self Care | Payer: Managed Care, Other (non HMO) | Attending: Family Medicine | Admitting: Family Medicine

## 2017-01-02 ENCOUNTER — Encounter (HOSPITAL_COMMUNITY): Payer: Self-pay | Admitting: Emergency Medicine

## 2017-01-02 DIAGNOSIS — Z3202 Encounter for pregnancy test, result negative: Secondary | ICD-10-CM

## 2017-01-02 DIAGNOSIS — I7300 Raynaud's syndrome without gangrene: Secondary | ICD-10-CM | POA: Insufficient documentation

## 2017-01-02 DIAGNOSIS — J45909 Unspecified asthma, uncomplicated: Secondary | ICD-10-CM | POA: Insufficient documentation

## 2017-01-02 DIAGNOSIS — R350 Frequency of micturition: Secondary | ICD-10-CM | POA: Diagnosis present

## 2017-01-02 DIAGNOSIS — R319 Hematuria, unspecified: Secondary | ICD-10-CM | POA: Diagnosis not present

## 2017-01-02 DIAGNOSIS — N390 Urinary tract infection, site not specified: Secondary | ICD-10-CM | POA: Insufficient documentation

## 2017-01-02 LAB — POCT URINALYSIS DIP (DEVICE)
BILIRUBIN URINE: NEGATIVE
Glucose, UA: NEGATIVE mg/dL
KETONES UR: NEGATIVE mg/dL
Nitrite: NEGATIVE
PROTEIN: NEGATIVE mg/dL
Specific Gravity, Urine: 1.015 (ref 1.005–1.030)
Urobilinogen, UA: 0.2 mg/dL (ref 0.0–1.0)
pH: 7 (ref 5.0–8.0)

## 2017-01-02 LAB — POCT PREGNANCY, URINE: Preg Test, Ur: NEGATIVE

## 2017-01-02 MED ORDER — NITROFURANTOIN MONOHYD MACRO 100 MG PO CAPS: 100.0000 mg | ORAL_CAPSULE | Freq: Two times a day (BID) | ORAL | 0 refills | Status: AC

## 2017-01-02 NOTE — Discharge Instructions (Signed)
Push fluids to emtpy bladder regularly.  Complete course of antibiotics.  We will notify of any positive findings  to your culture and if any changes to treatment are needed.  If increased pain, bleeding, fevers or back pain develop return to be seen or see your primary care provider

## 2017-01-02 NOTE — ED Triage Notes (Signed)
Pt sts dysuria and hematuria x 3 days

## 2017-01-02 NOTE — ED Provider Notes (Signed)
[facility]    CSN: [id number] Arrival date & time: 01/02/17  1910     History   Chief Complaint Chief Complaint  Patient presents with  . Urinary Tract Infection    HPI Stephanie Herman is a 36 y.o. female.   Stephanie Herman presents with complaints of urinary frequency, intermittent blood to urine and pressure with urination which started approximately 3 days ago. She just got back from vacation in [place]. Denies current pain, back pain, fevers, gi complaints. Denies vaginal discharge or itching. Denies any previous UTI. Has not taken any medications for her symptoms. Does not take any medications regularly.    ROS per HPI.       Past Medical History:  Diagnosis Date  . Asthma   . Missed abortion    no surgery required  . No pertinent past medical history   . PONV (postoperative nausea and vomiting)   . Raynaud's disease   . Shortness of breath    sports induced - no inhaler - last episode 6 yrs ago  . SVD (spontaneous vaginal delivery)    x 1    There are no active problems to display for this patient.   Past Surgical History:  Procedure Laterality Date  . DILATION AND EVACUATION  10/25/2011   Procedure: DILATATION AND EVACUATION;  Surgeon: Freddrick MarchKendra H. Tenny Crawoss, MD;  Location: [facility];  Service: Gynecology;  Laterality: N/A;  . KNEE SURGERY     bilateral  . WISDOM TOOTH EXTRACTION      OB History    Gravida Para Term Preterm AB Living   5 2 2   2 2    SAB TAB Ectopic Multiple Live Births   2       2       Home Medications    Prior to Admission medications   Medication Sig Start Date End Date Taking? Authorizing Provider  nitrofurantoin, macrocrystal-monohydrate, (MACROBID) 100 MG capsule Take 1 capsule (100 mg total) by mouth 2 (two) times daily for 5 days. 01/02/17 01/07/17  Georgetta HaberBurky, Natalie B, NP  Prenatal Vit-Fe Fumarate-FA (PRENATAL MULTIVITAMIN) TABS Take 1 tablet by mouth every morning.    [provider]    Family  History Family History  Problem Relation Age of Onset  . Anesthesia problems Neg Hx   . Arthritis Mother   . Hypertension Mother   . Arthritis Father   . Cancer Father   . Hypertension Father   . Arthritis Maternal Grandmother   . Cancer Maternal Grandmother   . Arthritis Maternal Grandfather   . Cancer Maternal Grandfather   . Heart disease Paternal Grandfather     Social History Social History   Tobacco Use  . Smoking status: Never Smoker  . Smokeless tobacco: Never Used  Substance Use Topics  . Alcohol use: No  . Drug use: No     Allergies   Codeine; Latex; Morphine and related; and Lidocaine   Review of Systems Review of Systems   Physical Exam Triage Vital Signs ED Triage Vitals [01/02/17 1934]  Enc Vitals Group     BP (!) 138/58     Pulse Rate (!) 50     Resp 18     Temp 98.4 F (36.9 C)     Temp Source Oral     SpO2 100 %     Weight      Height      Head Circumference      Peak Flow  Pain Score      Pain Loc      Pain Edu?      Excl. in GC?    No data found.  Updated Vital Signs BP (!) 138/58 (BP Location: Right Arm)   Pulse (!) 50   Temp 98.4 F (36.9 C) (Oral)   Resp 18   SpO2 100%   Visual Acuity Right Eye Distance:   Left Eye Distance:   Bilateral Distance:    Right Eye Near:   Left Eye Near:    Bilateral Near:     Physical Exam  Constitutional: She is oriented to person, place, and time. She appears well-developed and well-nourished. No distress.  Cardiovascular: Normal rate, regular rhythm and normal heart sounds.  Pulmonary/Chest: Effort normal and breath sounds normal.  Abdominal: Soft. There is no tenderness. There is no rigidity, no guarding and no CVA tenderness.  Neurological: She is alert and oriented to person, place, and time.  Skin: Skin is warm and dry.  Vitals reviewed.    UC Treatments / Results  Labs (all labs ordered are listed, but only abnormal results are displayed) Labs Reviewed  POCT  URINALYSIS DIP (DEVICE) - Abnormal; Notable for the following components:      Result Value   Hgb urine dipstick SMALL (*)    Leukocytes, UA SMALL (*)    All other components within normal limits  URINE CULTURE  POCT PREGNANCY, URINE    EKG  EKG Interpretation None       Radiology No results found.  Procedures Procedures (including critical care time)  Medications Ordered in UC Medications - No data to display   Initial Impression / Assessment and Plan / UC Course  I have reviewed the triage vital signs and the nursing notes.  Pertinent labs & imaging results that were available during my care of the patient were reviewed by me and considered in my medical decision making (see chart for details).     Leukocytes and blood to urine, with symptoms consistent with UTI. Complete course of macrobid. Sample sent for culture. Will notify of any positive findings and if any changes to treatment are needed. Push fluids. Return precautions provided. Patient verbalized understanding and agreeable to plan.    Final Clinical Impressions(s) / UC Diagnoses   Final diagnoses:  Urinary tract infection with hematuria, site unspecified    ED Discharge Orders        Ordered    nitrofurantoin, macrocrystal-monohydrate, (MACROBID) 100 MG capsule  2 times daily     01/02/17 1951       Controlled Substance Prescriptions [place] Controlled Substance Registry consulted? Not Applicable   Georgetta HaberBurky, Natalie B, NP 01/02/17 1958

## 2017-01-05 LAB — URINE CULTURE: Culture: 80000 — AB

## 2017-02-07 ENCOUNTER — Other Ambulatory Visit: Payer: Self-pay

## 2017-02-07 ENCOUNTER — Encounter (HOSPITAL_COMMUNITY): Payer: Self-pay

## 2017-02-07 ENCOUNTER — Emergency Department (HOSPITAL_COMMUNITY)
Admission: EM | Admit: 2017-02-07 | Discharge: 2017-02-07 | Disposition: A | Discharge status: Home / Self Care | Payer: Managed Care, Other (non HMO) | Attending: Emergency Medicine | Admitting: Emergency Medicine

## 2017-02-07 ENCOUNTER — Emergency Department (HOSPITAL_COMMUNITY): Payer: Managed Care, Other (non HMO)

## 2017-02-07 DIAGNOSIS — Y929 Unspecified place or not applicable: Secondary | ICD-10-CM | POA: Insufficient documentation

## 2017-02-07 DIAGNOSIS — X58XXXA Exposure to other specified factors, initial encounter: Secondary | ICD-10-CM | POA: Diagnosis not present

## 2017-02-07 DIAGNOSIS — Y999 Unspecified external cause status: Secondary | ICD-10-CM | POA: Insufficient documentation

## 2017-02-07 DIAGNOSIS — Y939 Activity, unspecified: Secondary | ICD-10-CM | POA: Insufficient documentation

## 2017-02-07 DIAGNOSIS — S060X0A Concussion without loss of consciousness, initial encounter: Secondary | ICD-10-CM

## 2017-02-07 DIAGNOSIS — J45909 Unspecified asthma, uncomplicated: Secondary | ICD-10-CM | POA: Insufficient documentation

## 2017-02-07 DIAGNOSIS — S0990XA Unspecified injury of head, initial encounter: Secondary | ICD-10-CM | POA: Diagnosis present

## 2017-02-07 NOTE — ED Notes (Signed)
Dr Zavitz at bedside  

## 2017-02-07 NOTE — Discharge Instructions (Signed)
If you were given medicines take as directed.  If you are on coumadin or contraceptives realize their levels and effectiveness is altered by many different medicines.  If you have any reaction (rash, tongues swelling, other) to the medicines stop taking and see a physician.    If your blood pressure was elevated in the ER make sure you follow up for management with a primary doctor or return for chest pain, shortness of breath or stroke symptoms.  Please follow up as directed and return to the ER or see a physician for new or worsening symptoms.  Thank you. Vitals:   02/07/17 0950  BP: 137/69  Pulse: 60  Resp: 16  Temp: 98.5 F (36.9 C)  TempSrc: Oral  SpO2: 100%  Weight: 72.6 kg (160 lb)  Height: 5\' 6"  (1.676 m)
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
## 2017-02-07 NOTE — ED Provider Notes (Signed)
[facility] Provider Note   CSN: [id number] Arrival date & time: 02/07/17  [id number]     History   Chief Complaint Chief Complaint  Patient presents with  . Head Injury    HPI Stephanie Herman is a 36 y.o. female.  Patient with asthma history presents with dizziness and worsening symptoms with exercise head pressure. 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 Patient feels since then every time she exercises she gets worsening symptoms pressure and dizziness. No neurologic symptoms.no blood thinners.      Past Medical History:  Diagnosis Date  . Asthma   . Missed abortion    no surgery required  . No pertinent past medical history   . PONV (postoperative nausea and vomiting)   . Raynaud's disease   . Shortness of breath    sports induced - no inhaler - last episode 6 yrs ago  . SVD (spontaneous vaginal delivery)    x 1    There are no active problems to display for this patient.   Past Surgical History:  Procedure Laterality Date  . DILATION AND EVACUATION  10/25/2011   Procedure: DILATATION AND EVACUATION;  Surgeon: Freddrick MarchKendra H. Tenny Crawoss, MD;  Location: [facility];  Service: Gynecology;  Laterality: N/A;  . KNEE SURGERY     bilateral  . WISDOM TOOTH EXTRACTION      OB History    Gravida Para Term Preterm AB Living   5 2 2   2 2    SAB TAB Ectopic Multiple Live Births   2       2       Home Medications    Prior to Admission medications   Medication Sig Start Date End Date Taking? Authorizing Provider  Prenatal Vit-Fe Fumarate-FA (PRENATAL MULTIVITAMIN) TABS Take 1 tablet by mouth every morning.    [provider]    Family History Family History  Problem Relation Age of Onset  . Arthritis Mother   . Hypertension Mother   . Arthritis Father   . Cancer Father   . Hypertension Father   . Arthritis Maternal Grandmother   . Cancer Maternal Grandmother   .  Arthritis Maternal Grandfather   . Cancer Maternal Grandfather   . Heart disease Paternal Grandfather   . Anesthesia problems Neg Hx     Social History Social History   Tobacco Use  . Smoking status: Never Smoker  . Smokeless tobacco: Never Used  Substance Use Topics  . Alcohol use: No  . Drug use: No     Allergies   Codeine; Latex; Morphine and related; and Lidocaine   Review of Systems Review of Systems  HENT: Negative for congestion.   Eyes: Negative for visual disturbance.  Gastrointestinal: Negative for abdominal pain and vomiting.  Musculoskeletal: Negative for back pain, neck pain and neck stiffness.  Skin: Negative for rash.  Neurological: Positive for dizziness and headaches. Negative for light-headedness.     Physical Exam Updated Vital Signs BP 137/69 (BP Location: Right Arm)   Pulse 60   Temp 98.5 F (36.9 C) (Oral)   Resp 16   Ht 5\' 6"  (1.676 m)   Wt 72.6 kg (160 lb)   LMP 01/17/2017 (Approximate)   SpO2 100%   BMI 25.82 kg/m   Physical Exam  Constitutional: She is oriented to person, place, and time. She appears well-developed and well-nourished.  HENT:  Head: Normocephalic and atraumatic.  Eyes:  Conjunctivae are normal. Right eye exhibits no discharge. Left eye exhibits no discharge.  Neck: Normal range of motion. Neck supple. No tracheal deviation present.  Cardiovascular: Normal rate.  Pulmonary/Chest: Effort normal.  Musculoskeletal: She exhibits no edema.  Neurological: She is alert and oriented to person, place, and time.  5+ strength in UE and LE with f/e at major joints. Sensation to palpation intact in UE and LE. CNs 2-12 grossly intact.  EOMFI.  PERRL.   Finger nose and coordination intact bilateral.   Visual fields intact to finger testing. No nystagmus   Skin: Skin is warm. No rash noted.  Psychiatric: She has a normal mood and affect.  Nursing note and vitals reviewed.    ED Treatments / Results  Labs (all labs ordered  are listed, but only abnormal results are displayed) Labs Reviewed - No data to display  EKG  EKG Interpretation None       Radiology Ct Head Wo Contrast  Result Date: 02/07/2017 CLINICAL DATA:  Pain following fall EXAM: CT HEAD WITHOUT CONTRAST TECHNIQUE: Contiguous axial images were obtained from the base of the skull through the vertex without intravenous contrast. COMPARISON:  None. FINDINGS: Brain: The ventricles are normal in size and configuration. There is no intracranial mass, hemorrhage, extra-axial fluid collection, or midline shift. Gray-white compartments appear normal. No evident acute infarct. Vascular: No hyperdense vessel. No vascular calcifications are evident. Skull: The bony calvarium appears intact. There are skin staples overlying the right parietal bone superiorly. Sinuses/Orbits: Visualized paranasal sinuses are clear. Visualized orbits appear symmetric bilaterally. Other: Mastoid air cells are clear. IMPRESSION: Skin staples overlying the right parietal bone superiorly. Study otherwise unremarkable. Electronically Signed   By: Bretta BangWilliam  Woodruff III M.D.   On: 02/07/2017 11:25    Procedures Procedures (including critical care time)  Medications Ordered in ED Medications - No data to display   Initial Impression / Assessment and Plan / ED Course  I have reviewed the triage vital signs and the nursing notes.  Pertinent labs & imaging results that were available during my care of the patient were reviewed by me and considered in my medical decision making (see chart for details).    Patient presents with clinically concussion. Patient CT scan performed no acute intracranial abnormalities seen such as bleeding. Discussed importance of avoiding exercise until all symptoms resolved and gradual return to exercise.    Final Clinical Impressions(s) / ED Diagnoses   Final diagnoses:  Concussion without loss of consciousness, initial encounter    ED Discharge  Orders    None       Blane OharaZavitz, Deshannon Hinchliffe, MD 02/07/17 1249

## 2018-02-23 DIAGNOSIS — M79641 Pain in right hand: Secondary | ICD-10-CM | POA: Insufficient documentation

## 2019-06-27 IMAGING — CT CT HEAD W/O CM
3 series · 15 of 47 positions shown, 18 images · non-contrast
Comparison: None.

CLINICAL DATA: Pain following fall

EXAM:
CT HEAD WITHOUT CONTRAST
TECHNIQUE: Contiguous axial images were obtained from the base of the skull
through the vertex without intravenous contrast.

[Series 3: head 5.0 h30s · axial · 0.41mm/px · z∈[-120,+15]mm · 9 of 33 slices shown, 12 images]
[im 3/33  brain]
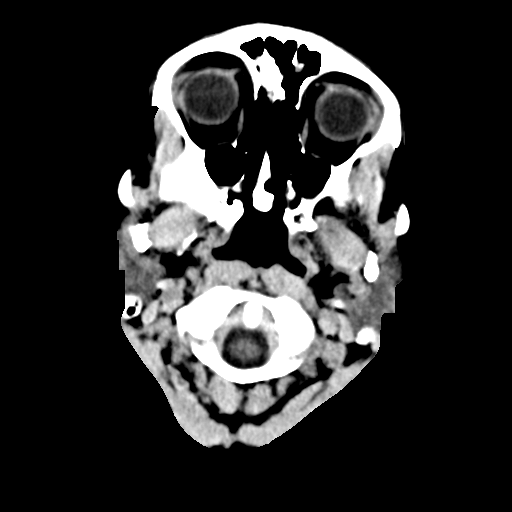
[im 3/33  bone]
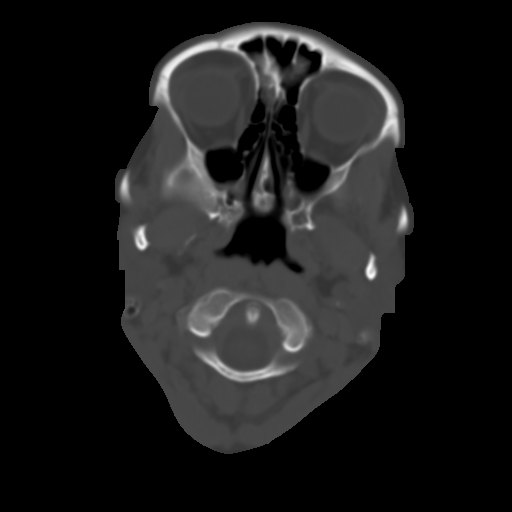
[im 6/33  brain]
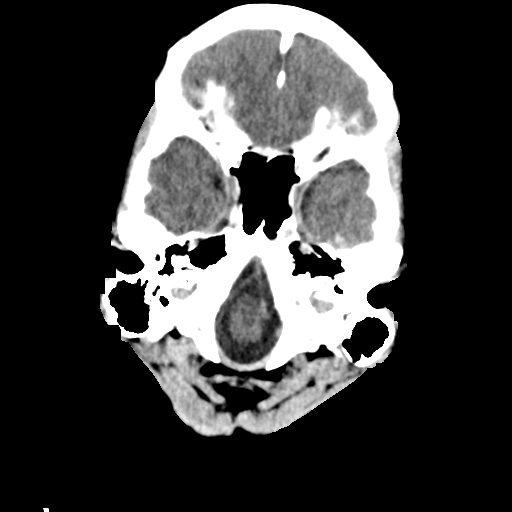
[im 9/33  brain]
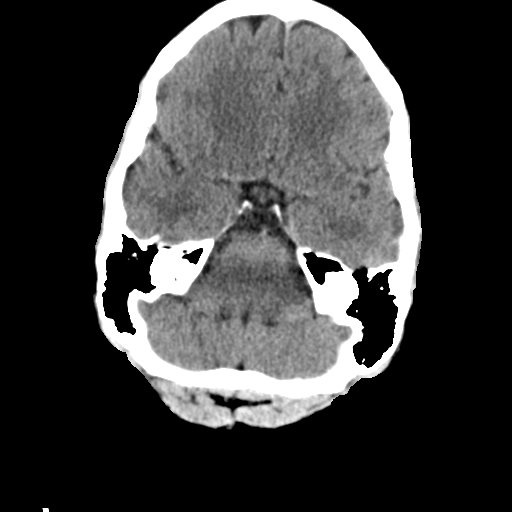
[im 13/33  brain]
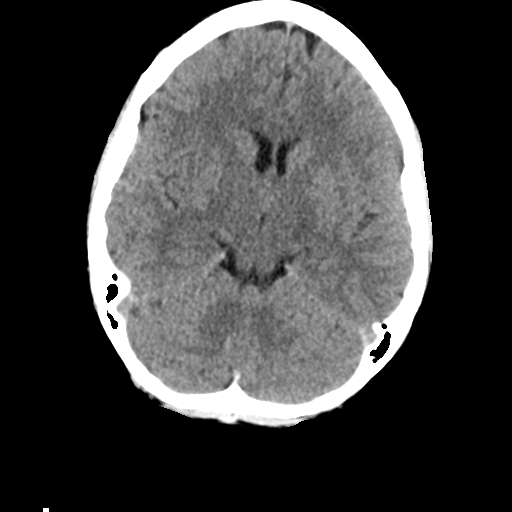
[im 17/33  brain]
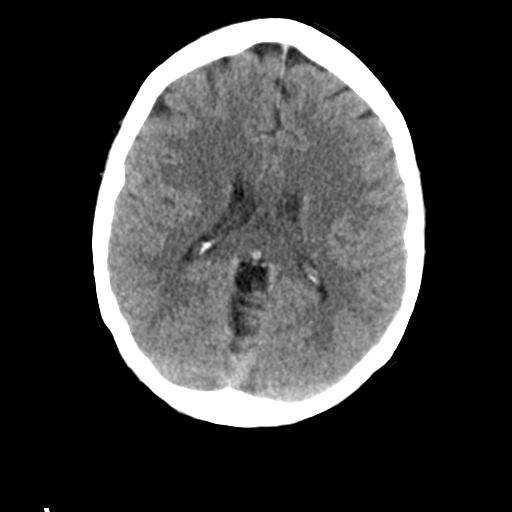
[im 17/33  bone]
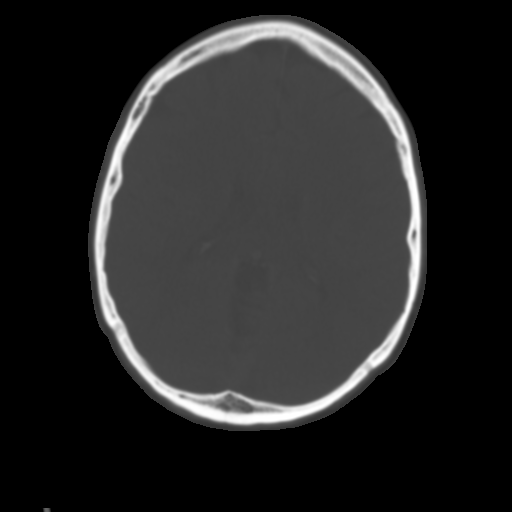
[im 20/33  brain]
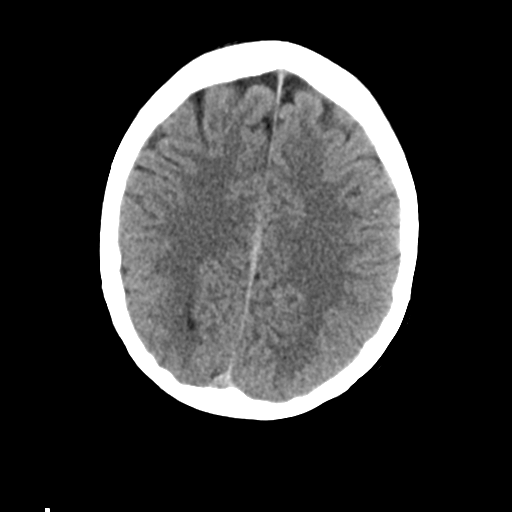
[im 24/33  brain]
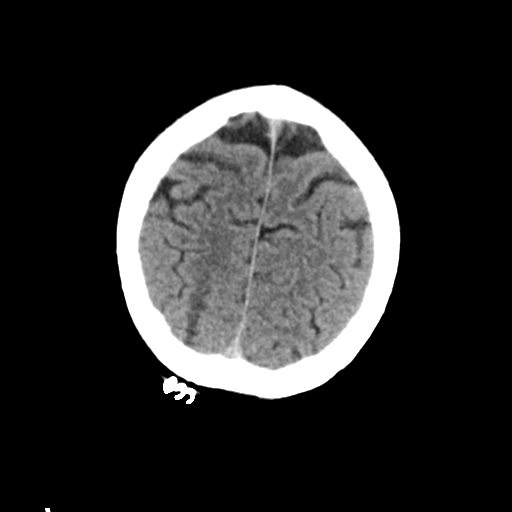
[im 27/33  brain]
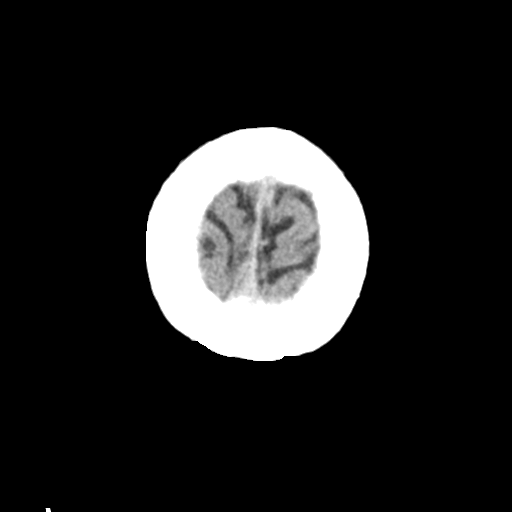
[im 30/33  brain]
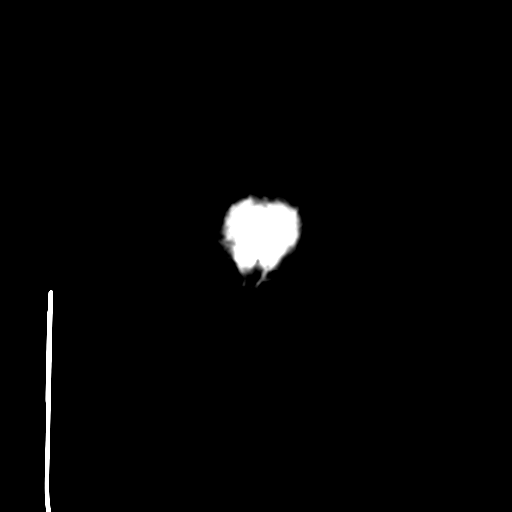
[im 30/33  bone]
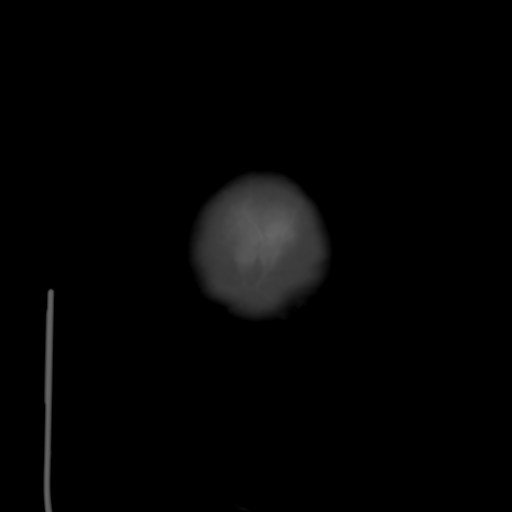

[Series 5: head 3.0 mpr cor · coronal · 0.33mm/px · 3 of 67 slices shown]
[im 23/67  brain]
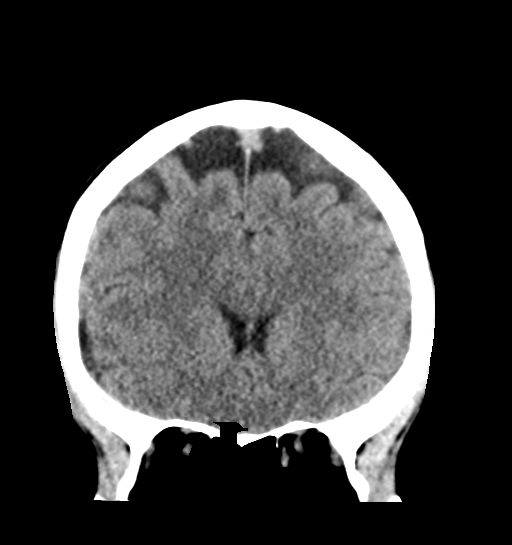
[im 30/67  brain]
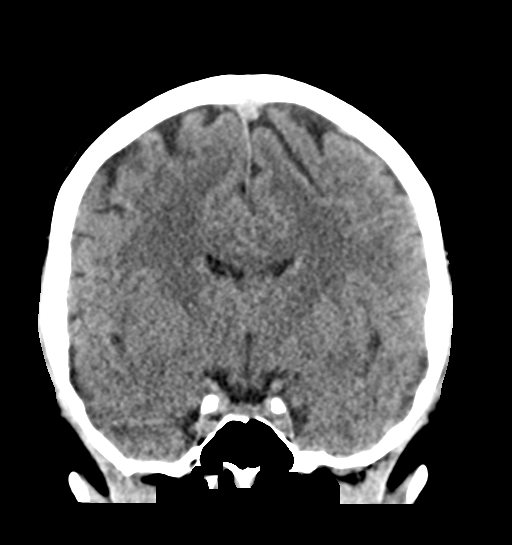
[im 37/67  brain]
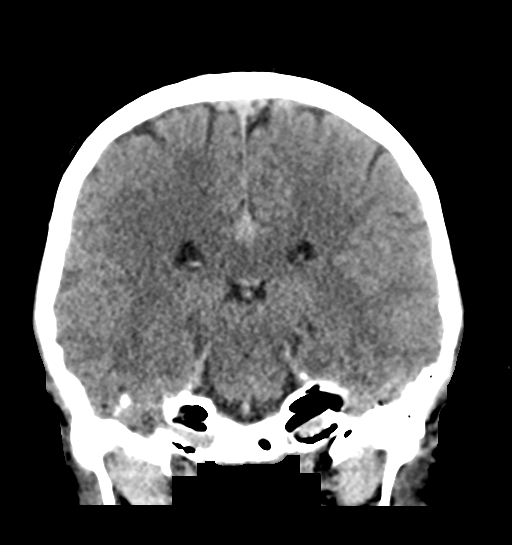

[Series 6: head 3.0 mpr sag · sagittal · 0.30mm/px · 3 of 67 slices shown]
[im 23/67  brain]
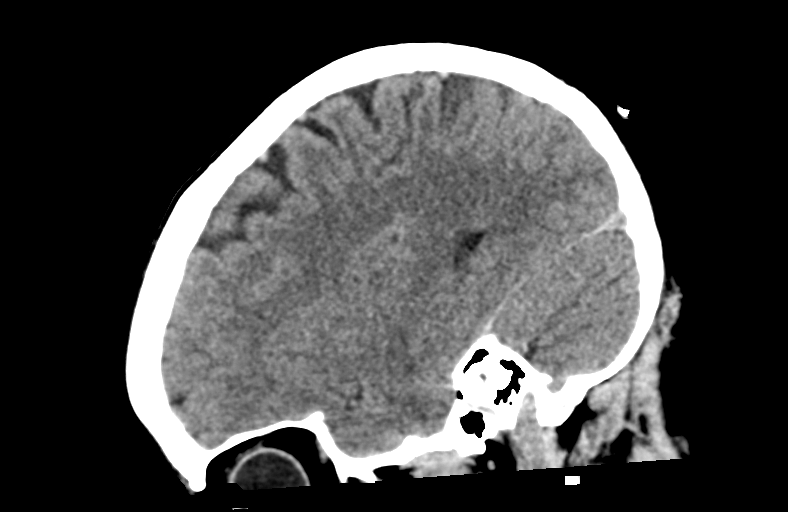
[im 34/67  brain]
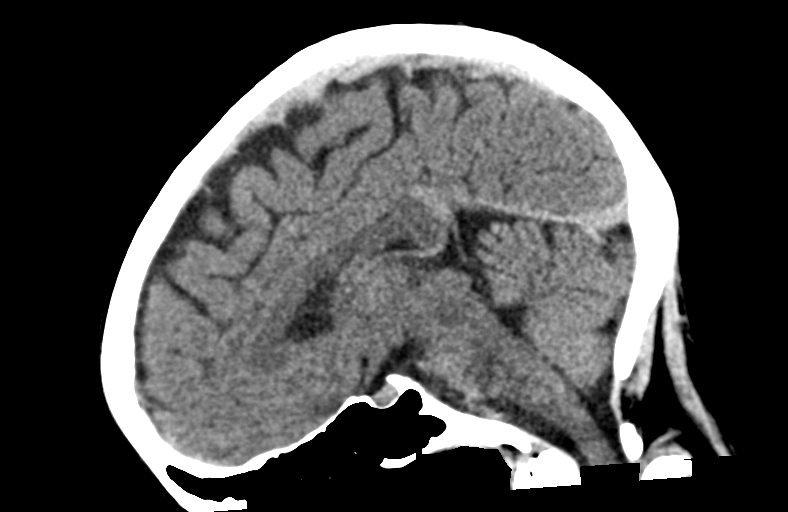
[im 45/67  brain]
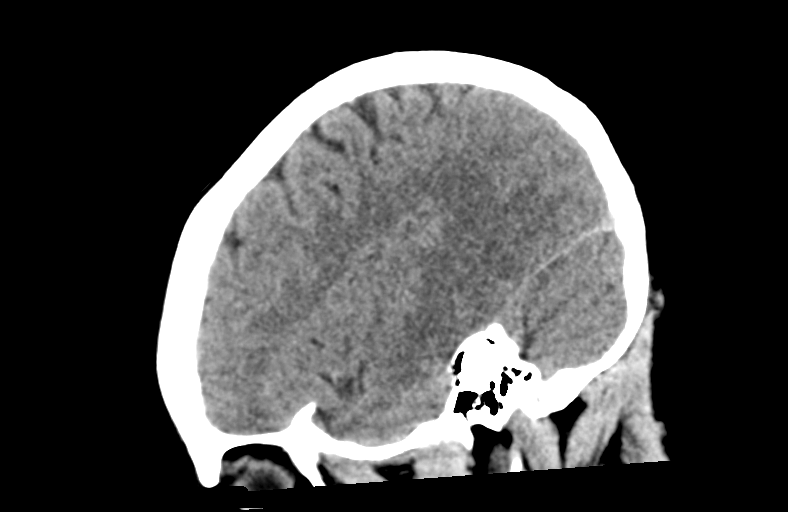

[15 of 47 positions shown; findings below may reference images not displayed]

FINDINGS: Brain: The ventricles are normal in size and configuration. There is
no intracranial mass, hemorrhage, extra-axial fluid collection, or
midline shift. Gray-white compartments appear normal. No evident
acute infarct.

Vascular: No hyperdense vessel. No vascular calcifications are
evident.

Skull: The bony calvarium appears intact. There are skin staples
overlying the right parietal bone superiorly.

Sinuses/Orbits: Visualized paranasal sinuses are clear. Visualized
orbits appear symmetric bilaterally.

Other: Mastoid air cells are clear.
IMPRESSION: Skin staples overlying the right parietal bone superiorly. Study
otherwise unremarkable.

## 2019-07-03 ENCOUNTER — Other Ambulatory Visit: Payer: Self-pay

## 2019-07-03 ENCOUNTER — Encounter: Payer: Self-pay | Admitting: Podiatry

## 2019-07-03 ENCOUNTER — Ambulatory Visit: Payer: Managed Care, Other (non HMO) | Admitting: Podiatry

## 2019-07-03 ENCOUNTER — Ambulatory Visit (INDEPENDENT_AMBULATORY_CARE_PROVIDER_SITE_OTHER): Payer: Managed Care, Other (non HMO)

## 2019-07-03 DIAGNOSIS — M722 Plantar fascial fibromatosis: Secondary | ICD-10-CM

## 2019-07-03 MED ORDER — MELOXICAM 15 MG PO TABS
15.0000 mg | ORAL_TABLET | Freq: Every day | ORAL | 3 refills | Status: DC
Start: 2019-07-03 — End: 2020-03-20

## 2019-07-03 MED ORDER — METHYLPREDNISOLONE 4 MG PO TBPK
ORAL_TABLET | ORAL | 0 refills | Status: DC
Start: 2019-07-03 — End: 2019-08-09

## 2019-07-04 NOTE — Progress Notes (Signed)
Subjective:  Patient ID: Stephanie Herman, female    DOB: 30-Dec-1980,  MRN: [id number] HPI Chief Complaint  Patient presents with  . Foot Pain    Arch and some plantar heel bilateral (L>R) - aching x 2 years continuous, AM pain, did see doc - injections only, also tried a nught splint, PT, decreased activity and exercise, different shoes, now having calf pain  . New Patient (Initial Visit)    39 y.o. female presents with the above complaint.   ROS: Denies fever chills nausea vomiting muscle aches pains calf pain back pain chest pain shortness of breath.  Past Medical History:  Diagnosis Date  . Asthma   . Missed abortion    no surgery required  . No pertinent past medical history   . PONV (postoperative nausea and vomiting)   . Raynaud's disease   . Shortness of breath    sports induced - no inhaler - last episode 6 yrs ago  . SVD (spontaneous vaginal delivery)    x 1   Past Surgical History:  Procedure Laterality Date  . DILATION AND EVACUATION  10/25/2011   Procedure: DILATATION AND EVACUATION;  Surgeon: Freddrick March. Tenny Craw, MD;  Location: [facility];  Service: Gynecology;  Laterality: N/A;  . KNEE SURGERY     bilateral  . WISDOM TOOTH EXTRACTION      Current Outpatient Medications:  Marland Kitchen  MELATONIN ER PO, Take by mouth., Disp: , Rfl:  .  meloxicam (MOBIC) 15 MG tablet, Take 1 tablet (15 mg total) by mouth daily., Disp: 30 tablet, Rfl: 3 .  methylPREDNISolone (MEDROL DOSEPAK) 4 MG TBPK tablet, 6 day dose pack - take as directed, Disp: 21 tablet, Rfl: 0  Allergies  Allergen Reactions  . Codeine Anaphylaxis    Body goes into shock  . Latex Itching  . Morphine And Related Anaphylaxis    Body goes into shock  . Lidocaine Other (See Comments)    Pt states Liquid Anesthesia makes her violently sick.  "I can tolerate it on my skin and by injection"   Review of Systems Objective:  There were no vitals filed for this visit.  General: Well developed, nourished, in no acute  distress, alert and oriented x3   Dermatological: Skin is warm, dry and supple bilateral. Nails x 10 are well maintained; remaining integument appears unremarkable at this time. There are no open sores, no preulcerative lesions, no rash or signs of infection present.  Vascular: Dorsalis Pedis artery and Posterior Tibial artery pedal pulses are 2/4 bilateral with immedate capillary fill time. Pedal hair growth present. No varicosities and no lower extremity edema present bilateral.   Neruologic: Grossly intact via light touch bilateral. Vibratory intact via tuning fork bilateral. Protective threshold with Semmes Wienstein monofilament intact to all pedal sites bilateral. Patellar and Achilles deep tendon reflexes 2+ bilateral. No Babinski or clonus noted bilateral.   Musculoskeletal: No gross boney pedal deformities bilateral. No pain, crepitus, or limitation noted with foot and ankle range of motion bilateral. Muscular strength 5/5 in all groups tested bilateral.  Severe pain on palpation medial calcaneal tubercle of the left heel.  No pain on medial and lateral compression of the calcaneus.  No pain on palpation of the Achilles.  Gait: Unassisted, Nonantalgic.    Radiographs:  Radiographs taken today demonstrate osseously mature individual small plantar distally already calcaneal heel spur soft tissue increase in density at the plantar fascial calcaneal insertion sites indicative of plantar fasciitis.  Not a lot of thickening  of the Achilles tendon minimal spurring posteriorly no acute abnormalities are identified osseously.  Assessment & Plan:   Assessment: Chronic intractable plantar fasciitis x2 years left greater than right  Plan: Started her on a Medrol Dosepak to be followed by meloxicam.  Injected the bilateral heels today 20 mg Kenalog 5 mg of Marcaine.  She tolerated procedure well without complications.  I also discussed with her orthotics and she will do that as soon as Liliane Channel returns  from vacation we discussed appropriate shoe gear stretching exercises ice therapy and shoe gear modifications.     Max T. [place], [place]

## 2019-07-19 ENCOUNTER — Other Ambulatory Visit: Payer: Self-pay

## 2019-07-19 ENCOUNTER — Ambulatory Visit (INDEPENDENT_AMBULATORY_CARE_PROVIDER_SITE_OTHER): Payer: Managed Care, Other (non HMO) | Admitting: Orthotics

## 2019-07-19 DIAGNOSIS — M216X1 Other acquired deformities of right foot: Secondary | ICD-10-CM | POA: Diagnosis not present

## 2019-07-19 DIAGNOSIS — M216X2 Other acquired deformities of left foot: Secondary | ICD-10-CM | POA: Diagnosis not present

## 2019-07-19 DIAGNOSIS — M722 Plantar fascial fibromatosis: Secondary | ICD-10-CM

## 2019-07-19 NOTE — Progress Notes (Signed)

## 2019-08-09 ENCOUNTER — Encounter: Payer: Self-pay | Admitting: Podiatry

## 2019-08-09 ENCOUNTER — Ambulatory Visit: Payer: Managed Care, Other (non HMO) | Admitting: Podiatry

## 2019-08-09 ENCOUNTER — Other Ambulatory Visit: Payer: Self-pay

## 2019-08-09 ENCOUNTER — Ambulatory Visit: Payer: Managed Care, Other (non HMO) | Admitting: Orthotics

## 2019-08-09 DIAGNOSIS — M722 Plantar fascial fibromatosis: Secondary | ICD-10-CM

## 2019-08-09 NOTE — Progress Notes (Signed)
Patient came in today to pick up custom made foot orthotics.  The goals were accomplished and the patient reported no dissatisfaction with said orthotics.  Patient was advised of breakin period and how to report any issues. 

## 2019-08-09 NOTE — Progress Notes (Signed)
Rick dispensed orthotics today. Patient would like to give the orthotics a try along with some new shoes. She will reappoint as needed.

## 2019-09-06 ENCOUNTER — Telehealth: Payer: Self-pay | Admitting: Podiatry

## 2019-09-06 DIAGNOSIS — M216X1 Other acquired deformities of right foot: Secondary | ICD-10-CM

## 2019-09-06 DIAGNOSIS — M216X2 Other acquired deformities of left foot: Secondary | ICD-10-CM

## 2019-09-06 NOTE — Telephone Encounter (Signed)
Pt left message on 7.27 @ 847am stating she likes her orthotics and wants to order another pair.   I returned call and pt wants another pair just like the ones she has and wants the self pay for the second pr of 219.00. I told pt I would get them ordered and would call when they come in and she can just pick them up.Marland KitchenMarland Kitchen

## 2020-03-20 ENCOUNTER — Ambulatory Visit: Payer: Managed Care, Other (non HMO) | Admitting: Podiatry

## 2020-03-20 ENCOUNTER — Encounter: Payer: Self-pay | Admitting: Podiatry

## 2020-03-20 ENCOUNTER — Other Ambulatory Visit: Payer: Self-pay

## 2020-03-20 DIAGNOSIS — S93692A Other sprain of left foot, initial encounter: Secondary | ICD-10-CM | POA: Diagnosis not present

## 2020-03-22 NOTE — Progress Notes (Signed)
She presents today for follow-up of her bilateral heel pain since the last left 1 is a really just acting up is not doing any good at all the right when this minimally bothersome.  States that after the last injections nothing really seem to improve significantly other than for maybe about 3 weeks.  Objective: Vital signs are stable she alert oriented x3 still has severe pain on palpation Nupercaine tubercle of the left heel.  Assessment: I think there is probably a tear of her plantar fascial left.  Right foot is most likely compensatory.  Plan: At this point since is been going on so long and no conservative therapy has alleviated her symptoms at all I feel that an MRI is necessary for differential diagnosis as well as surgical consideration.  So we will request an MRI of the left heel for plantar fascial rupture.

## 2020-04-06 ENCOUNTER — Other Ambulatory Visit: Payer: PRIVATE HEALTH INSURANCE

## 2020-08-25 ENCOUNTER — Other Ambulatory Visit: Payer: Self-pay

## 2020-08-25 ENCOUNTER — Ambulatory Visit: Payer: Managed Care, Other (non HMO) | Admitting: Podiatry

## 2020-08-25 ENCOUNTER — Ambulatory Visit: Payer: Managed Care, Other (non HMO)

## 2020-08-25 DIAGNOSIS — S93601A Unspecified sprain of right foot, initial encounter: Secondary | ICD-10-CM

## 2020-08-25 DIAGNOSIS — M7671 Peroneal tendinitis, right leg: Secondary | ICD-10-CM | POA: Diagnosis not present

## 2020-08-25 NOTE — Progress Notes (Signed)
   HPI: 40 y.o. female presenting today presenting today as an established patient for new complaint regarding pain and tenderness to the right foot.  Patient states on 08/19/2020 she twisted her foot while playing pickle ball.  She went to an urgent care where she states the physician diagnosed her with a fracture while the radiologist read the x-rays as negative for fracture.  She has been weightbearing in the boot with the assistance of crutches.  She presents for further treatment and evaluation  Past Medical History:  Diagnosis Date   Asthma    Missed abortion    no surgery required   No pertinent past medical history    PONV (postoperative nausea and vomiting)    Raynaud's disease    Shortness of breath    sports induced - no inhaler - last episode 6 yrs ago   SVD (spontaneous vaginal delivery)    x 1     Physical Exam: General: The patient is alert and oriented x3 in no acute distress.  Dermatology: Skin is warm, dry and supple bilateral lower extremities. Negative for open lesions or macerations.  Vascular: Palpable pedal pulses bilaterally. No edema or erythema noted. Capillary refill within normal limits.  Neurological: Epicritic and protective threshold grossly intact bilaterally.   Musculoskeletal Exam: Range of motion within normal limits to all pedal and ankle joints bilateral. Muscle strength 5/5 in all groups bilateral.  Pain on palpation to the fifth metatarsal tubercle at the insertion of the peroneus brevis onto the fifth metatarsal.  Pain with plantar flexion and inversion noted as well   Assessment: 1.  Insertional peroneal tendinitis/foot sprain right.  DOI: 08/19/2020   Plan of Care:  1. Patient evaluated. X-Rays reviewed that were taken in the outpatient setting on 08/19/2020.  I do not see any suspect fracture.  I would suspect this injury is more soft tissue tendon and ligamentous 2.  Continue weightbearing in the cam boot x2 additional weeks.  Patient may  discontinue the crutches.  Patient states she has no pain when she is walking in the cam boot 3.  Transition into good supportive sneakers after wearing the cam boot for 2 weeks 4.  Patient declined any oral anti-inflammatories 5.  Return to clinic as needed      Felecia Shelling, DPM [facility]  Dr. Felecia Shelling, DPM    [address] [place], [place] [address]                Office [phone]  Fax [phone]

## 2021-09-25 ENCOUNTER — Telehealth: Payer: Self-pay | Admitting: Podiatry

## 2021-09-25 NOTE — Telephone Encounter (Signed)
Patient called she would like to order another set of orthotics - last time she was seen was 7/22. I advised her she would most like need another appointment, she would like call back from orthotic department.

## 2021-11-16 ENCOUNTER — Ambulatory Visit: Payer: Managed Care, Other (non HMO) | Admitting: Podiatry

## 2021-11-16 DIAGNOSIS — M722 Plantar fascial fibromatosis: Secondary | ICD-10-CM

## 2021-11-16 NOTE — Progress Notes (Signed)
   Chief Complaint  Patient presents with   new inserts    Patient is here for new orthotics, she has plantar fasciitis.    Subjective: 41 y.o. female presenting today for chronic plantar fasciitis to the bilateral feet.  Patient has a long history of plantar fasciitis.  She continues to have pain when she does not wear her old orthotics.  She had orthotics made a few years ago.  She is requesting new orthotics today   Past Medical History:  Diagnosis Date   Asthma    Missed abortion    no surgery required   No pertinent past medical history    PONV (postoperative nausea and vomiting)    Raynaud's disease    Shortness of breath    sports induced - no inhaler - last episode 6 yrs ago   SVD (spontaneous vaginal delivery)    x 1     Objective: Physical Exam General: The patient is alert and oriented x3 in no acute distress.  Dermatology: Skin is warm, dry and supple bilateral lower extremities. Negative for open lesions or macerations bilateral.   Vascular: Dorsalis Pedis and Posterior Tibial pulses palpable bilateral.  Capillary fill time is immediate to all digits.  Neurological: Epicritic and protective threshold intact bilateral.   Musculoskeletal: Tenderness to palpation to the plantar aspect of the bilateral heels along the plantar fascia. All other joints range of motion within normal limits bilateral. Strength 5/5 in all groups bilateral.    Assessment: 1.  Chronic intermittent plantar fasciitis bilateral feet  Plan of Care:  1. Patient evaluated.  2.  Patient is requesting new custom molded orthotics today.  Today the patient was molded for custom molded orthotics 3.  In the meantime continue wearing good supportive shoes and sneakers  4.  Return to clinic when orthotics are ready for dispensable  Edrick Kins, DPM [facility]  Dr. Edrick Kins, DPM    [address], [place] [address]                 Office [phone]  Fax [phone]

## 2021-12-16 ENCOUNTER — Other Ambulatory Visit: Payer: Managed Care, Other (non HMO)

## 2022-02-25 NOTE — Progress Notes (Signed)
Patient presents today to pick up custom molded foot orthotics recommended by Dr. Amalia Hailey.   Orthotics were dispensed and fit was satisfactory. Reviewed instructions for break-in and wear. Written instructions given to patient.  Patient will follow up as needed.   [facility] - order # [id number]

## 2022-02-26 ENCOUNTER — Ambulatory Visit: Payer: Managed Care, Other (non HMO) | Admitting: *Deleted

## 2022-02-26 DIAGNOSIS — M722 Plantar fascial fibromatosis: Secondary | ICD-10-CM

## 2023-05-10 ENCOUNTER — Ambulatory Visit: Payer: Self-pay | Admitting: Internal Medicine

## 2023-06-03 ENCOUNTER — Telehealth: Payer: Self-pay

## 2023-06-03 NOTE — Telephone Encounter (Signed)
 LVM to schedule with Rice Chamorro- soonest NP appt available

## 2023-06-03 NOTE — Telephone Encounter (Signed)
 Copied from CRM [id number]. Topic: Appointments - Scheduling Inquiry for Clinic >> Jun 03, 2023 10:09 AM [place] J wrote: Reason for CRM: Patient would like to be squeezed into anywhere there's a new patient availability.

## 2023-06-10 ENCOUNTER — Ambulatory Visit: Payer: PRIVATE HEALTH INSURANCE | Admitting: Internal Medicine

## 2023-08-25 ENCOUNTER — Ambulatory Visit: Payer: Self-pay | Admitting: Family Medicine

## 2023-08-25 ENCOUNTER — Encounter: Payer: Self-pay | Admitting: Family Medicine

## 2023-08-25 ENCOUNTER — Ambulatory Visit (HOSPITAL_COMMUNITY)
Admission: RE | Admit: 2023-08-25 | Discharge: 2023-08-25 | Disposition: A | Discharge status: Home / Self Care | Source: Ambulatory Visit | Attending: Family Medicine | Admitting: Family Medicine

## 2023-08-25 VITALS — BP 128/79 | HR 69 | Ht 66.0 in | Wt 144.0 lb

## 2023-08-25 DIAGNOSIS — Z0001 Encounter for general adult medical examination with abnormal findings: Secondary | ICD-10-CM | POA: Diagnosis not present

## 2023-08-25 DIAGNOSIS — Z23 Encounter for immunization: Secondary | ICD-10-CM

## 2023-08-25 DIAGNOSIS — E038 Other specified hypothyroidism: Secondary | ICD-10-CM

## 2023-08-25 DIAGNOSIS — R0602 Shortness of breath: Secondary | ICD-10-CM | POA: Insufficient documentation

## 2023-08-25 DIAGNOSIS — E782 Mixed hyperlipidemia: Secondary | ICD-10-CM

## 2023-08-25 DIAGNOSIS — D509 Iron deficiency anemia, unspecified: Secondary | ICD-10-CM

## 2023-08-25 DIAGNOSIS — E538 Deficiency of other specified B group vitamins: Secondary | ICD-10-CM

## 2023-08-25 DIAGNOSIS — R7301 Impaired fasting glucose: Secondary | ICD-10-CM | POA: Diagnosis not present

## 2023-08-25 DIAGNOSIS — Z1159 Encounter for screening for other viral diseases: Secondary | ICD-10-CM

## 2023-08-25 DIAGNOSIS — E559 Vitamin D deficiency, unspecified: Secondary | ICD-10-CM

## 2023-08-25 NOTE — Assessment & Plan Note (Signed)

## 2023-08-25 NOTE — Progress Notes (Signed)
 Complete physical exam  Patient: Stephanie Herman   DOB: 12/01/1980   43 y.o. Female  MRN: [id number]  Subjective:    Chief Complaint  Patient presents with   Establish Care    Stephanie Herman is a 43 y.o. female who presents today for a complete physical exam. She reports consuming a general diet. The patient has a physically strenuous job, but has no regular exercise apart from work.  She generally feels well. She reports sleeping well. She does have additional problems to discuss today.    Most recent fall risk assessment:    08/25/2023    1:03 PM  Fall Risk   Falls in the past year? 0  Number falls in past yr: 0  Injury with Fall? 0  Risk for fall due to : No Fall Risks  Follow up Falls evaluation completed     Most recent depression screenings:    08/25/2023    1:03 PM  PHQ 2/9 Scores  PHQ - 2 Score 0  PHQ- 9 Score 0    Vision:Not within last year  and Dental: No current dental problems and Receives regular dental care  Patient Care Team: Del Wilhelmena Falter, Hilario, FNP as PCP - General (Family Medicine) Wonda Cy JAYSON, RD as Dietitian (Family Medicine)   Outpatient Medications Prior to Visit  Medication Sig   MELATONIN ER PO Take by mouth.   No facility-administered medications prior to visit.    Review of Systems  Constitutional:  Negative for chills and fever.  HENT:  Negative for ear pain.   Eyes:  Negative for blurred vision.  Respiratory:  Negative for shortness of breath.   Cardiovascular:  Negative for chest pain.  Gastrointestinal:  Negative for abdominal pain.  Genitourinary:  Negative for dysuria.  Musculoskeletal:  Negative for joint pain.  Skin:  Negative for rash.  Neurological:  Negative for dizziness and headaches.  Psychiatric/Behavioral:  The patient is not nervous/anxious.        Objective:    BP 128/79   Pulse 69   Ht 5' 6 (1.676 m)   Wt 144 lb (65.3 kg)   SpO2 98%   BMI 23.24 kg/m  BP Readings from Last 3  Encounters:  08/25/23 128/79  02/07/17 118/60  01/02/17 (!) 138/58      Physical Exam Vitals reviewed.  Constitutional:      General: She is not in acute distress.    Appearance: Normal appearance. She is not ill-appearing, toxic-appearing or diaphoretic.  HENT:     Head: Normocephalic.     Right Ear: Tympanic membrane normal.     Left Ear: Tympanic membrane normal.     Nose: Nose normal.     Mouth/Throat:     Mouth: Mucous membranes are moist.  Eyes:     General:        Right eye: No discharge.        Left eye: No discharge.     Conjunctiva/sclera: Conjunctivae normal.     Pupils: Pupils are equal, round, and reactive to light.  Cardiovascular:     Rate and Rhythm: Normal rate.     Pulses: Normal pulses.     Heart sounds: Normal heart sounds.  Pulmonary:     Effort: Pulmonary effort is normal. No respiratory distress.     Breath sounds: Normal breath sounds.  Abdominal:     General: Bowel sounds are normal.     Palpations: Abdomen is soft.     Tenderness:  There is no abdominal tenderness. There is no right CVA tenderness, left CVA tenderness or guarding.  Musculoskeletal:        General: Normal range of motion.     Cervical back: Normal range of motion.  Skin:    General: Skin is warm and dry.     Capillary Refill: Capillary refill takes less than 2 seconds.  Neurological:     Mental Status: She is alert.     Coordination: Coordination normal.     Gait: Gait normal.  Psychiatric:        Mood and Affect: Mood normal.        Behavior: Behavior normal.      No results found for any visits on 08/25/23.    Assessment & Plan:    Routine Health Maintenance and Physical Exam  Immunization History  Administered Date(s) Administered   Hep B, Unspecified 02/09/2004   Hepatitis A, Adult 02/09/2004   Influenza, Mdck, Trivalent,PF 6+ MOS(egg free) 10/25/2012   Influenza, Seasonal, Injecte, Preservative Fre 12/01/2010, 12/22/2011, 11/27/2014, 11/10/2015    Influenza,inj,Quad PF,6+ Mos 12/01/2010, 12/22/2011, 11/27/2014, 11/10/2015   Influenza,trivalent, recombinat, inj, PF 12/01/2010, 12/22/2011   Influenza-Unspecified 12/01/2010, 12/22/2011, 10/25/2012, 11/27/2014, 11/10/2015, 11/08/2017, 11/11/2018   MMR 02/09/2004, 04/04/2013, 04/04/2013   PFIZER(Purple Top)SARS-COV-2 Vaccination 03/14/2019, 04/05/2019   PPD Test 12/01/2010, 12/01/2010   Tdap 07/10/2010, 09/10/2010, 09/10/2010, 10/30/2012, 08/25/2023    Health Maintenance  Topic Date Due   Hepatitis C Screening  Never done   Hepatitis B Vaccines (1 of 3 - 19+ 3-dose series) Never done   HPV VACCINES (1 - Risk 3-dose SCDM series) Never done   COVID-19 Vaccine (3 - Pfizer risk series) 05/03/2019   Cervical Cancer Screening (HPV/Pap Cotest)  12/24/2020   INFLUENZA VACCINE  09/09/2023   DTaP/Tdap/Td (6 - Td or Tdap) 08/24/2033   HIV Screening  Completed   Meningococcal B Vaccine  Aged Out    Discussed health benefits of physical activity, and encouraged her to engage in regular exercise appropriate for her age and condition.  Mixed hyperlipidemia -     Lipid panel -     CMP14+EGFR -     CBC with Differential/Platelet  TSH (thyroid -stimulating hormone deficiency) -     TSH + free T4  IFG (impaired fasting glucose) -     Hemoglobin A1c  Vitamin D deficiency -     VITAMIN D 25 Hydroxy (Vit-D Deficiency, Fractures)  Need for hepatitis C screening test -     Hepatitis C antibody  Vitamin B12 deficiency -     Vitamin B12  Iron deficiency anemia, unspecified iron deficiency anemia type -     Iron, TIBC and Ferritin Panel  SOB (shortness of breath) -     DG Chest 2 View; Future  Encounter for immunization -     Tdap vaccine greater than or equal to 7yo IM  Encounter for routine adult physical exam with abnormal findings Assessment & Plan: A comprehensive physical examination was completed, and necessary labs were ordered. Screening and health maintenance recommendations  have been updated. The patient received counseling on exercise and nutrition. BMI was assessed and discussed Advise for heart health, focus on: Eat more fruits and vegetables: Aim for a variety of colors. Choose whole grains: Brown rice, oats, and whole-wheat bread. Limit unhealthy fats: Avoid trans fats; use olive or avocado oil instead. Include lean proteins: Opt for fish, chicken, beans, and legumes. Reduce sodium: Limit processed foods and add less salt. Stay hydrated: Drink plenty  of water. Exercise regularly: Aim for at least 30 minutes of moderate exercise, like walking or cycling, 5 days a week.       Return in about 1 year (around 08/24/2024), or if symptoms worsen or fail to improve, for routine labs, Annual Physical.     Hilario Kidd Wilhelmena Falter, FNP

## 2023-08-25 NOTE — Patient Instructions (Addendum)
        Great to see you today.  I have refilled the medication(s) we provide.   If labs were collected, we will inform you of lab results once received either by echart message or telephone call.   - echart message- for normal results that have been seen by the patient already.   - telephone call: abnormal results or if patient has not viewed results in their echart.   - Please take medications as prescribed. - Follow up with your primary health provider if any health concerns arises. - If symptoms worsen please contact your primary care provider and/or visit the emergency department.   --------------------------------------------------------------------------------------------------------  The Gardasil (HPV) vaccine is not routinely recommended at age 18, but it can still be given in certain cases   Current CDC Guidelines (as of 2024): Ages 9-26: Routine recommendation for HPV vaccination.  Ages 65-45: Not routinely recommended, but may be given based on individual risk factors   People in this age group may still benefit if they are not already exposed to all HPV strains in the vaccine.  Gardasil 9 protects against 9 strains, including high-risk types like HPV 16 and 18 (linked to cervical, anal, throat cancers) and low-risk types like 6 and 11 (genital warts).   You can still receive the vaccine if: Ages 92-45: Shared clinical decision-making is recommended. Vaccination may still be beneficial, especially if:  The patient has not been previously vaccinated.  The patient has had few sexual partners.  The patient is entering a new sexual relationship.  Vaccine Summary: Gardasil 9 is the only HPV vaccine currently available.  Series: 3 doses (at 0, 1-2 months, and 6 months).  Very safe and well-tolerated.

## 2023-08-26 ENCOUNTER — Ambulatory Visit: Payer: Self-pay | Admitting: Family Medicine

## 2023-08-26 LAB — TSH+FREE T4
Free T4: 1.57 ng/dL (ref 0.82–1.77)
TSH: 1.95 u[IU]/mL (ref 0.450–4.500)

## 2023-08-26 LAB — CBC WITH DIFFERENTIAL/PLATELET
Basophils Absolute: 0 x10E3/uL (ref 0.0–0.2)
Basos: 1 %
EOS (ABSOLUTE): 0 x10E3/uL (ref 0.0–0.4)
Eos: 1 %
Hematocrit: 43.8 % (ref 34.0–46.6)
Hemoglobin: 14.8 g/dL (ref 11.1–15.9)
Immature Grans (Abs): 0 x10E3/uL (ref 0.0–0.1)
Immature Granulocytes: 0 %
Lymphocytes Absolute: 1.8 x10E3/uL (ref 0.7–3.1)
Lymphs: 30 %
MCH: 32.7 pg (ref 26.6–33.0)
MCHC: 33.8 g/dL (ref 31.5–35.7)
MCV: 97 fL (ref 79–97)
Monocytes Absolute: 0.5 x10E3/uL (ref 0.1–0.9)
Monocytes: 9 %
Neutrophils Absolute: 3.6 x10E3/uL (ref 1.4–7.0)
Neutrophils: 59 %
Platelets: 168 x10E3/uL (ref 150–450)
RBC: 4.53 x10E6/uL (ref 3.77–5.28)
RDW: 12 % (ref 11.7–15.4)
WBC: 6 x10E3/uL (ref 3.4–10.8)

## 2023-08-26 LAB — CMP14+EGFR
ALT: 28 IU/L (ref 0–32)
AST: 22 IU/L (ref 0–40)
Albumin: 4.6 g/dL (ref 3.9–4.9)
Alkaline Phosphatase: 48 IU/L (ref 44–121)
BUN/Creatinine Ratio: 22 (ref 9–23)
BUN: 19 mg/dL (ref 6–24)
Bilirubin Total: 0.6 mg/dL (ref 0.0–1.2)
CO2: 21 mmol/L (ref 20–29)
Calcium: 9.9 mg/dL (ref 8.7–10.2)
Chloride: 105 mmol/L (ref 96–106)
Creatinine, Ser: 0.87 mg/dL (ref 0.57–1.00)
Globulin, Total: 2 g/dL (ref 1.5–4.5)
Glucose: 66 mg/dL — ABNORMAL LOW (ref 70–99)
Potassium: 4.2 mmol/L (ref 3.5–5.2)
Sodium: 142 mmol/L (ref 134–144)
Total Protein: 6.6 g/dL (ref 6.0–8.5)
eGFR: 86 mL/min/1.73 (ref >59)

## 2023-08-26 LAB — IRON,TIBC AND FERRITIN PANEL
Ferritin: 255 ng/mL — ABNORMAL HIGH (ref 15–150)
Iron Saturation: 59 % — ABNORMAL HIGH (ref 15–55)
Iron: 143 ug/dL (ref 27–159)
Total Iron Binding Capacity: 243 ug/dL — ABNORMAL LOW (ref 250–450)
UIBC: 100 ug/dL — ABNORMAL LOW (ref 131–425)

## 2023-08-26 LAB — HEMOGLOBIN A1C
Est. average glucose Bld gHb Est-mCnc: 100 mg/dL
Hgb A1c MFr Bld: 5.1 % (ref 4.8–5.6)

## 2023-08-26 LAB — LIPID PANEL
Chol/HDL Ratio: 2.2 ratio (ref 0.0–4.4)
Cholesterol, Total: 180 mg/dL (ref 100–199)
HDL: 81 mg/dL (ref >39)
LDL Chol Calc (NIH): 88 mg/dL (ref 0–99)
Triglycerides: 54 mg/dL (ref 0–149)
VLDL Cholesterol Cal: 11 mg/dL (ref 5–40)

## 2023-08-26 LAB — HEPATITIS C ANTIBODY: Hep C Virus Ab: NONREACTIVE

## 2023-08-26 LAB — VITAMIN B12: Vitamin B-12: 677 pg/mL (ref 232–1245)

## 2023-08-26 LAB — VITAMIN D 25 HYDROXY (VIT D DEFICIENCY, FRACTURES): Vit D, 25-Hydroxy: 50.2 ng/mL (ref 30.0–100.0)

## 2024-03-13 ENCOUNTER — Other Ambulatory Visit: Payer: Self-pay | Admitting: Obstetrics and Gynecology

## 2024-03-13 DIAGNOSIS — R928 Other abnormal and inconclusive findings on diagnostic imaging of breast: Secondary | ICD-10-CM

## 2024-03-15 ENCOUNTER — Other Ambulatory Visit: Payer: Self-pay | Admitting: Medical Genetics

## 2024-03-21 ENCOUNTER — Other Ambulatory Visit

## 2024-03-21 ENCOUNTER — Encounter
